# Patient Record
Sex: Male | Born: 1970
Health system: Southern US, Community
[De-identification: ages and names within clinical notes are randomized; demographics above are authoritative.]

## PROBLEM LIST (undated history)

## (undated) DIAGNOSIS — I10 Essential (primary) hypertension: Secondary | ICD-10-CM

## (undated) DIAGNOSIS — E785 Hyperlipidemia, unspecified: Secondary | ICD-10-CM

## (undated) DIAGNOSIS — E291 Testicular hypofunction: Secondary | ICD-10-CM

## (undated) HISTORY — DX: Hyperlipidemia, unspecified: E78.5

## (undated) HISTORY — DX: Testicular hypofunction: E29.1

## (undated) HISTORY — DX: Essential (primary) hypertension: I10

## (undated) HISTORY — PX: TONSILLECTOMY: SUR1361

---

## 1999-02-12 ENCOUNTER — Emergency Department (HOSPITAL_COMMUNITY): Admission: EM | Admit: 1999-02-12 | Discharge: 1999-02-12 | Payer: Self-pay | Admitting: Emergency Medicine

## 2004-12-07 ENCOUNTER — Ambulatory Visit: Payer: Self-pay | Admitting: Licensed Clinical Social Worker

## 2005-05-04 ENCOUNTER — Ambulatory Visit: Payer: Self-pay | Admitting: Family Medicine

## 2005-09-14 ENCOUNTER — Ambulatory Visit: Payer: Self-pay | Admitting: Family Medicine

## 2005-12-17 ENCOUNTER — Ambulatory Visit: Payer: Self-pay | Admitting: Licensed Clinical Social Worker

## 2006-03-01 ENCOUNTER — Ambulatory Visit: Payer: Self-pay | Admitting: Licensed Clinical Social Worker

## 2006-03-08 ENCOUNTER — Ambulatory Visit: Payer: Self-pay | Admitting: Licensed Clinical Social Worker

## 2006-03-11 ENCOUNTER — Ambulatory Visit: Payer: Self-pay | Admitting: Licensed Clinical Social Worker

## 2006-03-22 ENCOUNTER — Ambulatory Visit: Payer: Self-pay | Admitting: Licensed Clinical Social Worker

## 2008-11-30 ENCOUNTER — Telehealth: Payer: Self-pay | Admitting: Family Medicine

## 2009-08-18 ENCOUNTER — Ambulatory Visit: Payer: Self-pay | Admitting: Family Medicine

## 2009-08-18 DIAGNOSIS — D239 Other benign neoplasm of skin, unspecified: Secondary | ICD-10-CM | POA: Insufficient documentation

## 2009-08-18 DIAGNOSIS — B351 Tinea unguium: Secondary | ICD-10-CM | POA: Insufficient documentation

## 2009-08-18 DIAGNOSIS — K429 Umbilical hernia without obstruction or gangrene: Secondary | ICD-10-CM | POA: Insufficient documentation

## 2009-08-18 LAB — CONVERTED CEMR LAB
Blood in Urine, dipstick: NEGATIVE
Glucose, Urine, Semiquant: NEGATIVE
Ketones, urine, test strip: NEGATIVE
Specific Gravity, Urine: 1.025
pH: 5

## 2009-08-19 LAB — CONVERTED CEMR LAB
AST: 32 units/L (ref 0–37)
BUN: 18 mg/dL (ref 6–23)
Basophils Absolute: 0 10*3/uL (ref 0.0–0.1)
Calcium: 9.8 mg/dL (ref 8.4–10.5)
Cholesterol: 323 mg/dL — ABNORMAL HIGH (ref 0–200)
Creatinine, Ser: 0.8 mg/dL (ref 0.4–1.5)
Direct LDL: 82.7 mg/dL
GFR calc non Af Amer: 113.79 mL/min (ref >60)
Glucose, Bld: 98 mg/dL (ref 70–99)
HCT: 46.5 % (ref 39.0–52.0)
HDL: 37.3 mg/dL — ABNORMAL LOW (ref >39.00)
Lymphocytes Relative: 28.1 % (ref 12.0–46.0)
Lymphs Abs: 1.8 10*3/uL (ref 0.7–4.0)
Monocytes Relative: 10.9 % (ref 3.0–12.0)
Platelets: 217 10*3/uL (ref 150.0–400.0)
RDW: 12.7 % (ref 11.5–14.6)
TSH: 0.94 microintl units/mL (ref 0.35–5.50)
Total Bilirubin: 0.5 mg/dL (ref 0.3–1.2)
Triglycerides: 1418 mg/dL — ABNORMAL HIGH (ref 0.0–149.0)

## 2010-04-25 NOTE — Assessment & Plan Note (Signed)
Summary: NEW PT/TO RE-EST/CJR   Vital Signs:  Patient profile:   40 year old male Height:      71 inches Weight:      210 pounds BMI:     29.39 BP sitting:   158 / 98  (left arm) Cuff size:   regular  Vitals Entered By: Raechel Ache, RN (Aug 18, 2009 9:07 AM) CC: To Re-establish, has a few concerns.   History of Present Illness: 39 yr old male to re-establish with [place] after an absence of about 4 years, and he wants a cpx. He is fasting. He asks me to look at 2 small skin lesions, one on both legs. these have been present for years, they do not change in appearance, and they are not symptomatic. second, he has developed a bulge at his belly button over the past few months which is new for him. There is no pain, and it does not bother him at all. Last, he has had an abnormal right great toenail for several years.   Preventive Screening-Counseling & Management  Alcohol-Tobacco     Smoking Status: never  Caffeine-Diet-Exercise     Does Patient Exercise: yes      Drug Use:  no.    Allergies (verified): No Known Drug Allergies  Past History:  Past Medical History: Hx chicken pox  Past Surgical History: Tonsillectomy  Family History: Reviewed history and no changes required. Family History Hypertension  Social History: Reviewed history and no changes required. Occupation: Copywriter, advertising Divorced Never Smoked Alcohol use-yes Drug use-no Regular exercise-yes Occupation:  employed Smoking Status:  never Drug Use:  no Does Patient Exercise:  yes  Review of Systems  The patient denies anorexia, fever, weight loss, weight gain, vision loss, decreased hearing, hoarseness, chest pain, syncope, dyspnea on exertion, peripheral edema, prolonged cough, headaches, hemoptysis, abdominal pain, melena, hematochezia, severe indigestion/heartburn, hematuria, incontinence, genital sores, muscle weakness, suspicious skin lesions, transient blindness, difficulty  walking, depression, unusual weight change, abnormal bleeding, enlarged lymph nodes, angioedema, breast masses, and testicular masses.    Physical Exam  General:  Well-developed,well-nourished,in no acute distress; alert,appropriate and cooperative throughout examination Head:  Normocephalic and atraumatic without obvious abnormalities. No apparent alopecia or balding. Eyes:  No corneal or conjunctival inflammation noted. EOMI. Perrla. Funduscopic exam benign, without hemorrhages, exudates or papilledema. Vision grossly normal. Ears:  External ear exam shows no significant lesions or deformities.  Otoscopic examination reveals clear canals, tympanic membranes are intact bilaterally without bulging, retraction, inflammation or discharge. Hearing is grossly normal bilaterally. Nose:  External nasal examination shows no deformity or inflammation. Nasal mucosa are pink and moist without lesions or exudates. Mouth:  Oral mucosa and oropharynx without lesions or exudates.  Teeth in good repair. Neck:  No deformities, masses, or tenderness noted. Chest Wall:  No deformities, masses, tenderness or gynecomastia noted. Lungs:  Normal respiratory effort, chest expands symmetrically. Lungs are clear to auscultation, no crackles or wheezes. Heart:  Normal rate and regular rhythm. S1 and S2 normal without gallop, murmur, click, rub or other extra sounds. Abdomen:  soft, non-tender, normal bowel sounds, no distention, no masses, no guarding, no rigidity, no rebound tenderness, no inguinal hernia, no hepatomegaly, and no splenomegaly.  Has a small reducible non-tender umbilical hernia.  Genitalia:  Testes bilaterally descended without nodularity, tenderness or masses. No scrotal masses or lesions. No penis lesions or urethral discharge. Msk:  No deformity or scoliosis noted of thoracic or lumbar spine.   Pulses:  R and L carotid,radial,femoral,dorsalis pedis and posterior tibial pulses are full and equal  bilaterally Extremities:  No clubbing, cyanosis, edema, or deformity noted with normal full range of motion of all joints.   Neurologic:  No cranial nerve deficits noted. Station and gait are normal. Plantar reflexes are down-going bilaterally. DTRs are symmetrical throughout. Sensory, motor and coordinative functions appear intact. Skin:  has a small well defined brown nodular lesion on both thighs Cervical Nodes:  No lymphadenopathy noted Axillary Nodes:  No palpable lymphadenopathy Inguinal Nodes:  No significant adenopathy Psych:  Cognition and judgment appear intact. Alert and cooperative with normal attention span and concentration. No apparent delusions, illusions, hallucinations   Impression & Recommendations:  Problem # 1:  HEALTH MAINTENANCE EXAM (ICD-V70.0)  Orders: UA Dipstick w/o Micro (automated)  (81003) Venipuncture (60454) TLB-Lipid Panel (80061-LIPID) TLB-BMP (Basic Metabolic Panel-BMET) (80048-METABOL) TLB-CBC Platelet - w/Differential (85025-CBCD) TLB-Hepatic/Liver Function Pnl (80076-HEPATIC) TLB-TSH (Thyroid Stimulating Hormone) (84443-TSH)  Problem # 2:  ONYCHOMYCOSIS (ICD-110.1)  His updated medication list for this problem includes:    Terbinafine Hcl 250 Mg Tabs (Terbinafine hcl) ..... Once daily  Problem # 3:  UMBILICAL HERNIA (ICD-553.1)  Problem # 4:  DERMATOFIBROMA (ICD-216.9)  Complete Medication List: 1)  Terbinafine Hcl 250 Mg Tabs (Terbinafine hcl) .... Once daily  Patient Instructions: 1)  Get labs. treat the toenail with Terbinafine. Do core exercises to tighten the abdominal muscles.  2)  Please schedule a follow-up appointment as needed .  Prescriptions: TERBINAFINE HCL 250 MG TABS (TERBINAFINE HCL) once daily  #30 x 2   Entered and Authorized by:   Nelwyn Salisbury MD   Signed by:   Nelwyn Salisbury MD on 08/18/2009   Method used:   Electronically to        [facility] # [phone]* (retail)       [address]       [place],  [place]  [address]       Ph: [phone]       Fax: [phone]   RxID:   [id number]   Laboratory Results   Urine Tests  Date/Time Recieved: Aug 18, 2009 10:49 AM  Date/Time Reported: Aug 18, 2009 10:49 AM   Routine Urinalysis   Color: yellow Appearance: Clear Glucose: negative   (Normal Range: Negative) Bilirubin: negative   (Normal Range: Negative) Ketone: negative   (Normal Range: Negative) Spec. Gravity: 1.025   (Normal Range: 1.003-1.035) Blood: negative   (Normal Range: Negative) pH: 5.0   (Normal Range: 5.0-8.0) Protein: negative   (Normal Range: Negative) Urobilinogen: 0.2   (Normal Range: 0-1) Nitrite: negative   (Normal Range: Negative) Leukocyte Esterace: negative   (Normal Range: Negative)    Comments: Wynona Canes, CMA  Aug 18, 2009 10:49 AM      Appended Document: NEW PT/TO RE-EST/CJR we discussed his mildly elevated BP, and I advised him to reduce his sodium intake. He will return in one month to recheck the BP.

## 2010-05-07 ENCOUNTER — Inpatient Hospital Stay (HOSPITAL_COMMUNITY)
Admission: EM | Admit: 2010-05-07 | Discharge: 2010-05-09 | DRG: 814 | Disposition: A | Discharge status: Home / Self Care | Payer: BC Managed Care – PPO | Source: Ambulatory Visit | Attending: Internal Medicine | Admitting: Internal Medicine

## 2010-05-07 DIAGNOSIS — R030 Elevated blood-pressure reading, without diagnosis of hypertension: Secondary | ICD-10-CM | POA: Diagnosis present

## 2010-05-07 DIAGNOSIS — R1013 Epigastric pain: Principal | ICD-10-CM | POA: Diagnosis present

## 2010-05-08 ENCOUNTER — Encounter (HOSPITAL_COMMUNITY): Payer: Self-pay | Admitting: Radiology

## 2010-05-08 ENCOUNTER — Emergency Department (HOSPITAL_COMMUNITY): Payer: BC Managed Care – PPO

## 2010-05-08 LAB — CARDIAC PANEL(CRET KIN+CKTOT+MB+TROPI)
Total CK: 163 U/L (ref 7–232)
Troponin I: <0.01 ng/mL (ref 0.00–0.06)

## 2010-05-08 LAB — COMPREHENSIVE METABOLIC PANEL
ALT: 40 U/L (ref 0–53)
AST: 31 U/L (ref 0–37)
CO2: 28 mEq/L (ref 19–32)
Chloride: 102 mEq/L (ref 96–112)
GFR calc Af Amer: >60 mL/min (ref >60)
GFR calc non Af Amer: >60 mL/min (ref >60)
Potassium: 4.1 mEq/L (ref 3.5–5.1)
Sodium: 142 mEq/L (ref 135–145)
Total Bilirubin: 0.8 mg/dL (ref 0.3–1.2)

## 2010-05-08 LAB — DIFFERENTIAL
Basophils Absolute: 0 10*3/uL (ref 0.0–0.1)
Lymphocytes Relative: 18 % (ref 12–46)
Monocytes Relative: 7 % (ref 3–12)
Neutro Abs: 9.2 10*3/uL — ABNORMAL HIGH (ref 1.7–7.7)

## 2010-05-08 LAB — RAPID URINE DRUG SCREEN, HOSP PERFORMED
Barbiturates: NOT DETECTED
Benzodiazepines: NOT DETECTED

## 2010-05-08 LAB — URINALYSIS, ROUTINE W REFLEX MICROSCOPIC
Bilirubin Urine: NEGATIVE
Hgb urine dipstick: NEGATIVE
Protein, ur: NEGATIVE mg/dL
Specific Gravity, Urine: 1.023 (ref 1.005–1.030)
Urobilinogen, UA: 0.2 mg/dL (ref 0.0–1.0)

## 2010-05-08 LAB — CBC
Hemoglobin: 15.8 g/dL (ref 13.0–17.0)
RBC: 5.34 MIL/uL (ref 4.22–5.81)
WBC: 12.4 10*3/uL — ABNORMAL HIGH (ref 4.0–10.5)

## 2010-05-08 LAB — LACTIC ACID, PLASMA: Lactic Acid, Venous: 1.5 mmol/L (ref 0.5–2.2)

## 2010-05-08 LAB — CK TOTAL AND CKMB (NOT AT ARMC)
CK, MB: 6.9 ng/mL (ref 0.3–4.0)
CK, MB: 4.8 ng/mL — ABNORMAL HIGH (ref 0.3–4.0)

## 2010-05-08 LAB — TROPONIN I
Troponin I: <0.01 ng/mL (ref 0.00–0.06)
Troponin I: 0.01 ng/mL (ref 0.00–0.06)

## 2010-05-08 MED ORDER — IOHEXOL 300 MG/ML  SOLN: 100.0000 mL | Freq: Once | INTRAMUSCULAR | Status: AC | PRN

## 2010-05-09 ENCOUNTER — Telehealth: Payer: Self-pay | Admitting: Family Medicine

## 2010-05-09 ENCOUNTER — Inpatient Hospital Stay (HOSPITAL_COMMUNITY): Payer: BC Managed Care – PPO

## 2010-05-09 LAB — DIFFERENTIAL
Basophils Relative: 0 % (ref 0–1)
Eosinophils Absolute: 0.1 10*3/uL (ref 0.0–0.7)
Eosinophils Relative: 1 % (ref 0–5)
Monocytes Absolute: 1.2 10*3/uL — ABNORMAL HIGH (ref 0.1–1.0)
Monocytes Relative: 12 % (ref 3–12)
Neutrophils Relative %: 72 % (ref 43–77)

## 2010-05-09 LAB — CBC
MCH: 30.1 pg (ref 26.0–34.0)
MCHC: 34.8 g/dL (ref 30.0–36.0)
MCV: 86.5 fL (ref 78.0–100.0)
Platelets: 197 10*3/uL (ref 150–400)
RBC: 4.89 MIL/uL (ref 4.22–5.81)
RDW: 12.7 % (ref 11.5–15.5)

## 2010-05-09 LAB — LIPASE, BLOOD: Lipase: 40 U/L (ref 11–59)

## 2010-05-09 LAB — COMPREHENSIVE METABOLIC PANEL
CO2: 27 mEq/L (ref 19–32)
Calcium: 9 mg/dL (ref 8.4–10.5)
Chloride: 104 mEq/L (ref 96–112)
Creatinine, Ser: 0.95 mg/dL (ref 0.4–1.5)
GFR calc non Af Amer: >60 mL/min (ref >60)
Glucose, Bld: 120 mg/dL — ABNORMAL HIGH (ref 70–99)
Total Bilirubin: 1.2 mg/dL (ref 0.3–1.2)

## 2010-05-09 LAB — PHOSPHORUS: Phosphorus: 2.4 mg/dL (ref 2.3–4.6)

## 2010-05-09 LAB — LIPID PANEL
Cholesterol: 181 mg/dL (ref 0–200)
Total CHOL/HDL Ratio: 4.4 RATIO

## 2010-05-09 LAB — GLUCOSE, CAPILLARY: Glucose-Capillary: 111 mg/dL — ABNORMAL HIGH (ref 70–99)

## 2010-05-09 LAB — PROTIME-INR: Prothrombin Time: 13.3 seconds (ref 11.6–15.2)

## 2010-05-09 MED ORDER — TECHNETIUM TC 99M MEBROFENIN IV KIT: 5.0000 | PACK | Freq: Once | INTRAVENOUS | Status: DC | PRN

## 2010-05-09 MED ORDER — TECHNETIUM TC 99M MEBROFENIN IV KIT
5.0000 | PACK | Freq: Once | INTRAVENOUS | Status: AC | PRN
  Administered 2010-05-09: 5 via INTRAVENOUS

## 2010-05-09 NOTE — Telephone Encounter (Signed)
Pt called and said that he is in hospital and is req that Dr Clent Ridges call him back. Pt has some questions.[phone]

## 2010-05-09 NOTE — Telephone Encounter (Signed)
Please find out what he needs?

## 2010-05-11 ENCOUNTER — Encounter: Payer: Self-pay | Admitting: Family Medicine

## 2010-05-11 ENCOUNTER — Ambulatory Visit (INDEPENDENT_AMBULATORY_CARE_PROVIDER_SITE_OTHER): Payer: BC Managed Care – PPO | Admitting: Family Medicine

## 2010-05-11 DIAGNOSIS — I10 Essential (primary) hypertension: Secondary | ICD-10-CM

## 2010-05-11 DIAGNOSIS — R1013 Epigastric pain: Secondary | ICD-10-CM

## 2010-05-11 MED ORDER — LISINOPRIL 10 MG PO TABS: 10.0000 mg | ORAL_TABLET | Freq: Every day | ORAL | Status: DC

## 2010-05-11 NOTE — Telephone Encounter (Signed)
Spoke with pt this am and he said someone called him yest and he has appt to see Dr Clent Ridges today.

## 2010-05-11 NOTE — Progress Notes (Signed)
  Subjective:    Patient ID: Kevin Wilkinson, male    DOB: 03/16/1971, 40 y.o.   MRN: [id number]  HPI Here to follow up on a hospital stay from 05-08-10 to 05-09-10 for severe epigastric pain with nausea and vomiting. His WBC count was mildly elevated to 12.4 but otherwise all labs were normal. An abdominal CT scan was normal. A HIDA scan showed a normal gallbladder and CBD, but the gallbladder EF was low. He recovered very quickly, and by the time of DC he was eating and drinking with no trouble at all. Since going home he has felt fine. It was decided that he probably had passed a gallstone, and I agree with this. He has been taking Pantoprazole and watching his diet carefully. Also, his BP was consistently high in the hospital, and in fact we have noted it to be high for the past year.    Review of Systems  Constitutional: Negative.   Respiratory: Negative.   Cardiovascular: Negative.   Gastrointestinal: Negative.        Objective:   Physical Exam  Constitutional: He appears well-developed and well-nourished.  Cardiovascular: Normal rate, regular rhythm, normal heart sounds and intact distal pulses.  Exam reveals no gallop and no friction rub.   No murmur heard. Pulmonary/Chest: Effort normal and breath sounds normal. No respiratory distress. He has no wheezes. He has no rales. He exhibits no tenderness.  Abdominal: Soft. Bowel sounds are normal. He exhibits no distension and no mass. There is no tenderness. There is no rebound and no guarding.          Assessment & Plan:  He seems to have passed a gallstone, and now he is doing well. Continue the diet and PPI. We will treat the HTN now,and he agrees.

## 2010-05-19 NOTE — Discharge Summary (Signed)
Kevin Wilkinson, Kevin Wilkinson                ACCOUNT NO.:  [id number]  MEDICAL RECORD NO.:  [id number]           PATIENT TYPE:  LOCATION:                                 FACILITY:  PHYSICIAN:  Rock Nephew, MD       DATE OF BIRTH:  March 27, 1970  DATE OF ADMISSION:  05/08/2010 DATE OF DISCHARGE:  05/09/2010                        DISCHARGE SUMMARY - REFERRING   PRIMARY CARE PHYSICIAN:  Tera Mater. Clent Ridges, MD.  DISCHARGE DIAGNOSES: 1. Epigastric pain of unclear etiology, possibly biliary colic versus     a passed gallstone, HIDA scan pending. 2. Increased blood pressure, episodic.  Discharge medications for the patient are as follows: 1. Zofran 4 mg by mouth every 6 hours as needed for nausea. 2. Pantoprazole 40 mg p.o. daily. 3. Vicodin 5/500 one tablet by mouth daily.  DISPOSITION:  The patient is discharged home.  DIET:  Regular.  FOLLOWUP:  The patient should follow up with his primary care physician, Dr. Tera Mater. Clent Ridges within 1 week.  The patient should obtain a referral to see a gastroenterologist for further evaluation.  He should see the gastroenterologist within 1-2 weeks.  PROCEDURES PERFORMED:  The patient had a CT scan of the abdomen and pelvis with contrast which showed no acute findings to explain the patient's symptoms, punctuate nonobstructive stone, upper pole of left kidney.  Small fat-continuing umbilical hernia.  The patient also has a HIDA scan which is pending, it will be dictated again.  CONSULTATIONS ON THIS CASE:  None.  BRIEF HISTORY AND PHYSICAL:  Epigastric pain in 40 year old gentleman with past history of occasional elevated blood pressure, for which, he is not taking anything.  He presented to the emergency department with severe epigastric pain that started about an hour go after he ate some chilly.  It was followed with nausea and vomiting.  The patient could not find a comfortable position.  He came to the hospital writhing in pain.  The patient's CT  scan and labs were essentially unremarkable.  He did have some leukocytosis.  HOSPITAL COURSE: 1. Epigastric pain.  The patient came to the hospital with epigastric     pain.  By the time I saw the patient on May 08, 2010, the     patient was completely asymptomatic.  He was able to eat all 100%     of his diet without any difficulty.  He was having no symptoms,     whatsoever.  The patient was placed on a PPI.  Right now, a HIDA     scan is pending.  If the HIDA scan is unremarkable, the patient     will be discharged home. 2. Increased blood pressure.  The patient had some elevated episodic     blood pressure.  The patient received IV fluids.  This could be a     contributing factor.  However, the patient may need to be on blood     pressure medications.  The patient should continue follow up with     Dr. Tera Mater. Clent Ridges and decide whether or not the patient should be  on any antihypertensives. 3. DVT prophylaxis.  For DVT prophylaxis, the patient received SCDs.  Please note that this is not an official document until electronically signed.     Rock Nephew, MD     NH/MEDQ  D:  05/09/2010  T:  05/09/2010  Job:  [id number]  cc:   Jeannett Senior A. Clent Ridges, MD  Electronically Signed by Rock Nephew MD on 05/19/2010 06:20:51 PM

## 2010-05-28 NOTE — H&P (Signed)
Kevin Wilkinson, Kevin Wilkinson                ACCOUNT NO.:  [id number]  MEDICAL RECORD NO.:  [id number]           PATIENT TYPE:  E  LOCATION:  [facility]                         FACILITY:  [facility]  PHYSICIAN:  Michiel Cowboy, MDDATE OF BIRTH:  12-29-1970  DATE OF ADMISSION:  05/07/2010 DATE OF DISCHARGE:                             HISTORY & PHYSICAL   PRIMARY CARE PROVIDER:  Tera Mater. Clent Ridges, MD  CHIEF COMPLAINT:  Epigastric pain.  The patient is a 40 year old gentleman with past history of occasionally elevated blood pressure for which he is not taking anything.  He presented to the emergency department with a severe epigastric pain that started about 1 hour after he ate some chilly.  It was followed with nausea and vomiting.  The patient could not find a comfortable position. He was writhing at the time of arrival to the emergency department.  All his labs were within normal limits.  His CT scan did not show any evidence of pancreatitis.  Gallbladder appeared to be normal, although this was not a dissection specific CT scan, they were able to see his blood flow very well and there was no evidence to be consistent with dissection.  The patient's pain is somewhat under control with Dilaudid.  His blood pressure was noted to be elevated in 200s.  He was given nitro with some improvement.  The patient currently with somewhat controlled pain, still has a little bit of nausea, but not currently vomiting.  He denies any chest pain in particular.  He states when his pain is severe, it is hard for him to take a deep breath, but when his pain is under better control, he is able to deep breathe without evidence of pleurisy.  No fevers.  No chills.  No diarrhea, otherwise review of systems only significant for epigastric pain, nausea, and vomiting.  PAST MEDICAL HISTORY:  Significant for occasionally elevated blood pressure, although he was never completely diagnosed with hypertension.  SOCIAL  HISTORY:  The patient does not smoke or drink or use drugs.  His last alcoholic drink was actually just a little bit on New Year.  FAMILY HISTORY:  Noncontributory.  ALLERGIES:  None.  MEDICATIONS:  None.  PHYSICAL EXAMINATION:  VITAL SIGNS:  Temperature 97.7, blood pressure initially 205/124 now down to 189/114, respirations 20s, heart rate 66, satting 97% on room air. GENERAL:  The patient does not appear to be in acute distress, although does appear to be in some pain, is somewhat pale. HEENT:  Head is nontraumatic.  Somewhat dry mucous membranes. LUNGS:  Clear to auscultation bilaterally. HEART:  Regular rhythm.  No murmurs appreciated. ABDOMEN:  No rigidity or peritoneal signs noted, but there is definitely decreased bowel sounds and significant epigastric tenderness. EXTREMITIES:  Lower extremities without clubbing, cyanosis, or edema. NEUROLOGIC:  Grossly intact.  LABORATORY DATA:  White blood cell count 12.4, hemoglobin 15.8.  Sodium 142, potassium 4.1, creatinine 1.19.  LFTs within normal limits.  Lipase 33.  Lactic acid 1.3.  Total CK 302, CK-MB 6.9, troponin within normal limits.  CT scan showing no gallstones on gallbladder.  Liver and biliary  tree within normal limits.  Pancreas within normal limits.  No evidence of dissection per further discussion with Radiology by Dr. Norlene Campbell.  EKG showing normal sinus rhythm.  He has some T-wave inversion in lead V1 and somewhat atypical ST segment in V2, although would not consider that an ischemic change at this point, otherwise unremarkable EKG.  ASSESSMENT AND PLAN:  This is a 40 year old gentleman with significant abdominal pain and significantly elevated blood pressure likely related to his pain.  Epigastric pain, etiology still unclear to me.  A CT scan did not show any evidence of ischemic changes or pancreatitis or cholecystitis is encouraging.  We will continue with pain management.  We will also write for Anaspaz,  order HIDA scan, repeat lipase, consider GI consult for possible EGD.  If otherwise workup is unremarkable, the patient did receive GI cocktail, we will see if this improves his symptoms.  We will put him on Protonix for now.  Elevated blood pressure; I wonder if he actually has an underlying hypertension.  For now, we will see if we can wean off the nitro drip and control with hydralazine and also see if his blood pressure improves once his pain is under control.  Prophylaxis, SCDs and Protonix.     Michiel Cowboy, MD     AVD/MEDQ  D:  05/08/2010  T:  05/08/2010  Job:  [id number]  cc:   Jeannett Senior A. Clent Ridges, MD  Electronically Signed by Therisa Doyne MD on 05/28/2010 10:06:50 PM

## 2010-08-23 ENCOUNTER — Encounter: Payer: Self-pay | Admitting: Family Medicine

## 2010-08-23 ENCOUNTER — Ambulatory Visit (INDEPENDENT_AMBULATORY_CARE_PROVIDER_SITE_OTHER): Payer: BC Managed Care – PPO | Admitting: Family Medicine

## 2010-08-23 VITALS — BP 132/90 | Temp 98.9°F | Wt 215.0 lb

## 2010-08-23 DIAGNOSIS — L509 Urticaria, unspecified: Secondary | ICD-10-CM

## 2010-08-23 DIAGNOSIS — K429 Umbilical hernia without obstruction or gangrene: Secondary | ICD-10-CM

## 2010-08-23 DIAGNOSIS — T6391XA Toxic effect of contact with unspecified venomous animal, accidental (unintentional), initial encounter: Secondary | ICD-10-CM

## 2010-08-23 DIAGNOSIS — T63441A Toxic effect of venom of bees, accidental (unintentional), initial encounter: Secondary | ICD-10-CM

## 2010-08-23 MED ORDER — METHYLPREDNISOLONE ACETATE 80 MG/ML IJ SUSP
120.0000 mg | Freq: Once | INTRAMUSCULAR | Status: AC
  Administered 2010-08-23: 120 mg via INTRAMUSCULAR

## 2010-08-23 MED ORDER — PREDNISONE (PAK) 10 MG PO TABS: ORAL_TABLET | ORAL | Status: DC

## 2010-08-23 NOTE — Progress Notes (Signed)
  Subjective:    Patient ID: Kevin Wilkinson, male    DOB: 09-13-1970, 40 y.o.   MRN: [id number]  HPI Here for 3 days of hives and swelling after he was stung by yellow jackets five times last weekend. He ran over a nest while mowing his yard, and he was stung twice on each lower leg and once on the right hand. The right hand immediately swelled up and has remained swollen and painful since then. He has developed itchy areas of rash over the trunk and the neck, and his lips started to swell today. No tongue swelling or trouble swallowing or SOB. He has been taking Benadryl.    Review of Systems  Constitutional: Negative.   HENT: Positive for facial swelling.   Respiratory: Negative.   Cardiovascular: Negative.   Skin: Positive for rash.       Objective:   Physical Exam  Constitutional: He appears well-developed and well-nourished.  HENT:       Both lips are mildly swollen   Neck: No thyromegaly present.  Cardiovascular: Normal rate, regular rhythm, normal heart sounds and intact distal pulses.   Pulmonary/Chest: Effort normal and breath sounds normal.  Lymphadenopathy:    He has no cervical adenopathy.  Skin:       He has scattered hives over the back of the neck and the trunk. The right hand is swollen, red, and tender           Assessment & Plan:  This is hives as an allergic reaction to multiple bee stings. He will continue to use Benadryl. Given a steroid shot today, to be followed by an oral taper pack for 6 days.

## 2010-08-24 ENCOUNTER — Ambulatory Visit: Payer: BC Managed Care – PPO | Admitting: Family Medicine

## 2011-05-14 ENCOUNTER — Other Ambulatory Visit: Payer: Self-pay | Admitting: Family Medicine

## 2011-05-15 ENCOUNTER — Telehealth: Payer: Self-pay | Admitting: Family Medicine

## 2011-05-15 MED ORDER — LISINOPRIL 10 MG PO TABS: 10.0000 mg | ORAL_TABLET | Freq: Every day | ORAL | Status: DC

## 2011-05-15 NOTE — Telephone Encounter (Signed)
Script sent e-scribe 

## 2011-08-09 ENCOUNTER — Other Ambulatory Visit (INDEPENDENT_AMBULATORY_CARE_PROVIDER_SITE_OTHER): Payer: BC Managed Care – PPO

## 2011-08-09 DIAGNOSIS — Z0000 Encounter for general adult medical examination without abnormal findings: Secondary | ICD-10-CM

## 2011-08-09 LAB — HEPATIC FUNCTION PANEL
AST: 24 U/L (ref 0–37)
Albumin: 4.4 g/dL (ref 3.5–5.2)
Alkaline Phosphatase: 55 U/L (ref 39–117)
Bilirubin, Direct: 0.1 mg/dL (ref 0.0–0.3)
Total Bilirubin: 1.2 mg/dL (ref 0.3–1.2)

## 2011-08-09 LAB — CBC WITH DIFFERENTIAL/PLATELET
Basophils Absolute: 0 10*3/uL (ref 0.0–0.1)
Hemoglobin: 14.9 g/dL (ref 13.0–17.0)
Lymphocytes Relative: 29.6 % (ref 12.0–46.0)
Monocytes Relative: 11.6 % (ref 3.0–12.0)
Neutro Abs: 3.3 10*3/uL (ref 1.4–7.7)
Neutrophils Relative %: 56.3 % (ref 43.0–77.0)
RDW: 12.8 % (ref 11.5–14.6)

## 2011-08-09 LAB — POCT URINALYSIS DIPSTICK
Leukocytes, UA: NEGATIVE
Nitrite, UA: NEGATIVE
Protein, UA: NEGATIVE
Urobilinogen, UA: 0.2
pH, UA: 5.5

## 2011-08-09 LAB — LIPID PANEL
HDL: 51.9 mg/dL (ref >39.00)
Total CHOL/HDL Ratio: 5
Triglycerides: 167 mg/dL — ABNORMAL HIGH (ref 0.0–149.0)
VLDL: 33.4 mg/dL (ref 0.0–40.0)

## 2011-08-09 LAB — BASIC METABOLIC PANEL
Calcium: 9.6 mg/dL (ref 8.4–10.5)
Creatinine, Ser: 0.9 mg/dL (ref 0.4–1.5)
GFR: 95.9 mL/min (ref >60.00)
Glucose, Bld: 81 mg/dL (ref 70–99)
Sodium: 138 mEq/L (ref 135–145)

## 2011-08-10 LAB — LDL CHOLESTEROL, DIRECT: Direct LDL: 163.7 mg/dL

## 2011-08-14 NOTE — Progress Notes (Signed)
Quick Note:  Left voice message with below information. ______

## 2011-08-15 ENCOUNTER — Encounter: Payer: Self-pay | Admitting: Family Medicine

## 2011-08-24 ENCOUNTER — Encounter: Payer: Self-pay | Admitting: Family Medicine

## 2011-08-24 ENCOUNTER — Ambulatory Visit (INDEPENDENT_AMBULATORY_CARE_PROVIDER_SITE_OTHER): Payer: BC Managed Care – PPO | Admitting: Family Medicine

## 2011-08-24 ENCOUNTER — Telehealth: Payer: Self-pay

## 2011-08-24 VITALS — BP 128/80 | HR 85 | Temp 98.6°F | Ht 70.75 in | Wt 205.0 lb

## 2011-08-24 DIAGNOSIS — Z0000 Encounter for general adult medical examination without abnormal findings: Secondary | ICD-10-CM

## 2011-08-24 MED ORDER — TERBINAFINE HCL 250 MG PO TABS: 250.0000 mg | ORAL_TABLET | Freq: Every day | ORAL | Status: AC

## 2011-08-24 MED ORDER — FLUTICASONE PROPIONATE 50 MCG/ACT NA SUSP: 2.0000 | Freq: Every day | NASAL | Status: DC

## 2011-08-24 MED ORDER — LISINOPRIL 10 MG PO TABS: 10.0000 mg | ORAL_TABLET | Freq: Every day | ORAL | Status: DC

## 2011-08-24 NOTE — Telephone Encounter (Signed)
This may be the result of nasal congestion. Call in [facility] for 2 sprays each nostril daily, one year supply. This should help after using it a week or so

## 2011-08-24 NOTE — Telephone Encounter (Signed)
I sent script e-scribe and spoke with pt. 

## 2011-08-24 NOTE — Telephone Encounter (Signed)
Call back on patient cell [phone]

## 2011-08-24 NOTE — Telephone Encounter (Signed)
Please call back on [phone]

## 2011-08-24 NOTE — Progress Notes (Signed)
  Subjective:    Patient ID: Kevin Wilkinson, male    DOB: Aug 17, 1970, 41 y.o.   MRN: [id number]  HPI 41 yr old male for a cpx. He feels well. He does ask to treat a fungal infection in the left great toenail.    Review of Systems  Constitutional: Negative.   HENT: Negative.   Eyes: Negative.   Respiratory: Negative.   Cardiovascular: Negative.   Gastrointestinal: Negative.   Genitourinary: Negative.   Musculoskeletal: Negative.   Skin: Negative.   Neurological: Negative.   Hematological: Negative.   Psychiatric/Behavioral: Negative.        Objective:   Physical Exam  Constitutional: He is oriented to person, place, and time. He appears well-developed and well-nourished. No distress.  HENT:  Head: Normocephalic and atraumatic.  Right Ear: External ear normal.  Left Ear: External ear normal.  Nose: Nose normal.  Mouth/Throat: Oropharynx is clear and moist. No oropharyngeal exudate.  Eyes: Conjunctivae and EOM are normal. Pupils are equal, round, and reactive to light. Right eye exhibits no discharge. Left eye exhibits no discharge. No scleral icterus.  Neck: Neck supple. No JVD present. No tracheal deviation present. No thyromegaly present.  Cardiovascular: Normal rate, regular rhythm, normal heart sounds and intact distal pulses.  Exam reveals no gallop and no friction rub.   No murmur heard. Pulmonary/Chest: Effort normal and breath sounds normal. No respiratory distress. He has no wheezes. He has no rales. He exhibits no tenderness.  Abdominal: Soft. Bowel sounds are normal. He exhibits no distension and no mass. There is no tenderness. There is no rebound and no guarding.  Genitourinary: Rectum normal, prostate normal and penis normal. Guaiac negative stool. No penile tenderness.  Musculoskeletal: Normal range of motion. He exhibits no edema and no tenderness.  Lymphadenopathy:    He has no cervical adenopathy.  Neurological: He is alert and oriented to person, place, and  time. He has normal reflexes. No cranial nerve deficit. He exhibits normal muscle tone. Coordination normal.  Skin: Skin is warm and dry. No rash noted. He is not diaphoretic. No erythema. No pallor.  Psychiatric: He has a normal mood and affect. His behavior is normal. Judgment and thought content normal.          Assessment & Plan:  Well exam. Treat the toenail with 3 months of Terbinafine.

## 2011-08-24 NOTE — Telephone Encounter (Signed)
Patient called in stating that he forgot to ask Dr Clent Ridges this morning about his snoring? Patient states his wife is telling him that he is snoring "bad" at night? Pt states that he feels like he is getting enough sleep at night. Patient would like to know if there is anything he should do/try to help this? Please advise?

## 2011-10-22 ENCOUNTER — Encounter: Payer: Self-pay | Admitting: Family Medicine

## 2011-10-22 ENCOUNTER — Ambulatory Visit (INDEPENDENT_AMBULATORY_CARE_PROVIDER_SITE_OTHER): Payer: BC Managed Care – PPO | Admitting: Family Medicine

## 2011-10-22 VITALS — BP 130/90 | HR 88 | Temp 98.7°F | Wt 207.0 lb

## 2011-10-22 DIAGNOSIS — R6882 Decreased libido: Secondary | ICD-10-CM

## 2011-10-22 NOTE — Progress Notes (Signed)
  Subjective:    Patient ID: Kevin Wilkinson, male    DOB: 07-Jul-1970, 41 y.o.   MRN: [id number]  HPI Here to discuss possible problems with low libido. He was here earlier this year for a cpx with labs, and everything came out fine including his thyroid function. He denies any erectile problems, but he says his fiance gets frustrated when his sexual desires do not match hers. This has caused some problems between them. He wants to get his testosterone level checked. He admits to some stress in his life, between his job, his relationships to his fiance, and his relationship to the children. They are engaged to be married in 2 months, and no doubt this is stressful for him. His he juggles his time with his own child (who lives with his ex-wife) and his fiance's child (who also spends some time with her ex-husband). He does exercise, and he thinks he gets enough sleep.    Review of Systems  Constitutional: Negative.   Genitourinary: Negative.   Neurological: Negative.   Psychiatric/Behavioral: Negative.        Objective:   Physical Exam  Constitutional: He appears well-developed and well-nourished.  Psychiatric: He has a normal mood and affect. His behavior is normal. Judgment and thought content normal.          Assessment & Plan:  Low libido. I think a lot of his problem is internalized stress, but we will check a testosterone level today as well. We spent 45 minutes together, greater than 50% of which was in counseling.

## 2011-10-29 NOTE — Progress Notes (Signed)
Quick Note:  I left voice message for pt to return my call. ______ 

## 2011-10-30 MED ORDER — TESTOSTERONE 20.25 MG/ACT (1.62%) TD GEL: 4.0000 "application " | Freq: Every day | TRANSDERMAL | Status: DC

## 2011-10-30 NOTE — Addendum Note (Signed)
Addended by: Aniceto Boss A on: 10/30/2011 08:58 AM   Modules accepted: Orders

## 2011-10-30 NOTE — Progress Notes (Signed)
Quick Note:  I spoke with pt and called in script ______ 

## 2012-05-17 ENCOUNTER — Other Ambulatory Visit: Payer: Self-pay | Admitting: Family Medicine

## 2012-05-19 NOTE — Telephone Encounter (Signed)
Refill for another 6 months  

## 2012-06-13 IMAGING — NM NM HEPATO W/GB/PHARM/[PERSON_NAME]
2 series · 12 of 12 positions shown · non-contrast
Comparison: none

CLINICAL DATA: Abdominal pain, nausea, vomiting.  Negative CT.

[he hepatobiliary · 3.43mm/px · 6 of 60 frames shown (1 of 2)]
[frame 6/60]
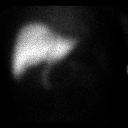
[frame 16/60]
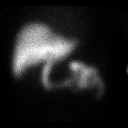
[frame 26/60]
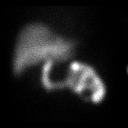
[frame 36/60]
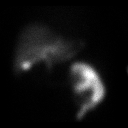
[frame 46/60]
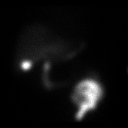
[frame 56/60]
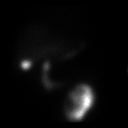

[he hepatobiliary · 3.43mm/px · 6 of 30 frames shown (2 of 2)]
[frame 3/30]
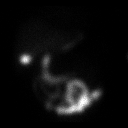
[frame 8/30]
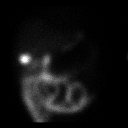
[frame 13/30]
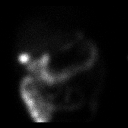
[frame 18/30]
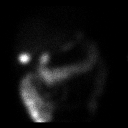
[frame 23/30]
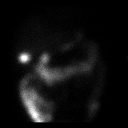
[frame 28/30]
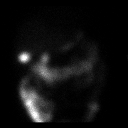

[12 of 12 positions shown; findings below may reference images not displayed]

HEPATOBILIARY SCINTIGRAPHY WITH EJECTION FRACTION

Anterior imaging after 9.MmCi ScUUU Choletec IV. There is prompt
clearance of the radiopharmaceutical from the blood pool. Timely
visualization of activity in central bile ducts, small bowel, and
gallbladder.
After 1 hour,1.91 mcg CCK was infused intravenously and imaging
continued. No symptoms reported with infusion. The calculated
>30% at  thirty minutes (Ziessman et al., 5886 J. Nuclear Med).

IMPRESSION
1. Patency of cystic and common bile ducts.
2. Low gallbladder ejection fraction.

## 2012-08-09 ENCOUNTER — Other Ambulatory Visit: Payer: Self-pay | Admitting: Family Medicine

## 2012-08-25 ENCOUNTER — Other Ambulatory Visit (INDEPENDENT_AMBULATORY_CARE_PROVIDER_SITE_OTHER): Payer: BC Managed Care – PPO

## 2012-08-25 DIAGNOSIS — Z0000 Encounter for general adult medical examination without abnormal findings: Secondary | ICD-10-CM

## 2012-08-25 LAB — POCT URINALYSIS DIPSTICK
Bilirubin, UA: NEGATIVE
Glucose, UA: NEGATIVE
Leukocytes, UA: NEGATIVE
Nitrite, UA: NEGATIVE
Urobilinogen, UA: 0.2

## 2012-08-25 LAB — HEPATIC FUNCTION PANEL
ALT: 32 U/L (ref 0–53)
Bilirubin, Direct: 0 mg/dL (ref 0.0–0.3)
Total Bilirubin: 1.1 mg/dL (ref 0.3–1.2)

## 2012-08-25 LAB — BASIC METABOLIC PANEL
BUN: 18 mg/dL (ref 6–23)
CO2: 26 mEq/L (ref 19–32)
Chloride: 103 mEq/L (ref 96–112)
Creatinine, Ser: 1 mg/dL (ref 0.4–1.5)
Glucose, Bld: 102 mg/dL — ABNORMAL HIGH (ref 70–99)
Potassium: 4.2 mEq/L (ref 3.5–5.1)

## 2012-08-25 LAB — LDL CHOLESTEROL, DIRECT: Direct LDL: 119.5 mg/dL

## 2012-08-25 LAB — CBC WITH DIFFERENTIAL/PLATELET
Basophils Relative: 0.4 % (ref 0.0–3.0)
Eosinophils Absolute: 0.2 10*3/uL (ref 0.0–0.7)
Eosinophils Relative: 3.7 % (ref 0.0–5.0)
Hemoglobin: 16.4 g/dL (ref 13.0–17.0)
Lymphocytes Relative: 24.4 % (ref 12.0–46.0)
MCHC: 35.3 g/dL (ref 30.0–36.0)
Neutro Abs: 3.2 10*3/uL (ref 1.4–7.7)
RBC: 5.2 Mil/uL (ref 4.22–5.81)
WBC: 5.4 10*3/uL (ref 4.5–10.5)

## 2012-08-25 LAB — LIPID PANEL
Total CHOL/HDL Ratio: 6
VLDL: 96 mg/dL — ABNORMAL HIGH (ref 0.0–40.0)

## 2012-08-26 NOTE — Progress Notes (Signed)
Quick Note:  Pt has appointment on 09/01/12 will go over then. ______

## 2012-09-01 ENCOUNTER — Encounter: Payer: BC Managed Care – PPO | Admitting: Family Medicine

## 2012-09-22 ENCOUNTER — Encounter: Payer: BC Managed Care – PPO | Admitting: Family Medicine

## 2012-10-16 ENCOUNTER — Ambulatory Visit (INDEPENDENT_AMBULATORY_CARE_PROVIDER_SITE_OTHER): Payer: BC Managed Care – PPO | Admitting: Family Medicine

## 2012-10-16 ENCOUNTER — Encounter: Payer: Self-pay | Admitting: Family Medicine

## 2012-10-16 VITALS — BP 140/90 | HR 77 | Temp 98.2°F | Ht 71.0 in | Wt 212.0 lb

## 2012-10-16 DIAGNOSIS — I10 Essential (primary) hypertension: Secondary | ICD-10-CM

## 2012-10-16 DIAGNOSIS — E291 Testicular hypofunction: Secondary | ICD-10-CM

## 2012-10-16 DIAGNOSIS — Z0000 Encounter for general adult medical examination without abnormal findings: Secondary | ICD-10-CM

## 2012-10-16 LAB — TESTOSTERONE: Testosterone: 102.42 ng/dL — ABNORMAL LOW (ref 350.00–890.00)

## 2012-10-16 MED ORDER — LISINOPRIL 20 MG PO TABS: 20.0000 mg | ORAL_TABLET | Freq: Every day | ORAL | Status: DC

## 2012-10-16 NOTE — Progress Notes (Signed)
  Subjective:    Patient ID: Kevin Wilkinson, male    DOB: 1970/08/24, 42 y.o.   MRN: [id number]  HPI 42 yr old male for a cpx. He feels well in general. He has been using Androgel for a year but he does not feel much different on it, as far as a low libido. He has put on a little weight recently.    Review of Systems  Constitutional: Negative.   HENT: Negative.   Eyes: Negative.   Respiratory: Negative.   Cardiovascular: Negative.   Gastrointestinal: Negative.   Genitourinary: Negative.   Musculoskeletal: Negative.   Skin: Negative.   Neurological: Negative.   Psychiatric/Behavioral: Negative.        Objective:   Physical Exam  Constitutional: He is oriented to person, place, and time. He appears well-developed and well-nourished. No distress.  HENT:  Head: Normocephalic and atraumatic.  Right Ear: External ear normal.  Left Ear: External ear normal.  Nose: Nose normal.  Mouth/Throat: Oropharynx is clear and moist. No oropharyngeal exudate.  Eyes: Conjunctivae and EOM are normal. Pupils are equal, round, and reactive to light. Right eye exhibits no discharge. Left eye exhibits no discharge. No scleral icterus.  Neck: Neck supple. No JVD present. No tracheal deviation present. No thyromegaly present.  Cardiovascular: Normal rate, regular rhythm, normal heart sounds and intact distal pulses.  Exam reveals no gallop and no friction rub.   No murmur heard. Pulmonary/Chest: Effort normal and breath sounds normal. No respiratory distress. He has no wheezes. He has no rales. He exhibits no tenderness.  Abdominal: Soft. Bowel sounds are normal. He exhibits no distension and no mass. There is no tenderness. There is no rebound and no guarding.  Genitourinary: Rectum normal, prostate normal and penis normal. Guaiac negative stool. No penile tenderness.  Musculoskeletal: Normal range of motion. He exhibits no edema and no tenderness.  Lymphadenopathy:    He has no cervical adenopathy.   Neurological: He is alert and oriented to person, place, and time. He has normal reflexes. No cranial nerve deficit. He exhibits normal muscle tone. Coordination normal.  Skin: Skin is warm and dry. No rash noted. He is not diaphoretic. No erythema. No pallor.  Psychiatric: He has a normal mood and affect. His behavior is normal. Judgment and thought content normal.          Assessment & Plan:  Well exam. He will watch the diet and exercise. Increase Lisinopril to 20 mg daily. Check a testosterone level

## 2012-10-17 DIAGNOSIS — I10 Essential (primary) hypertension: Secondary | ICD-10-CM | POA: Insufficient documentation

## 2012-10-17 DIAGNOSIS — E291 Testicular hypofunction: Secondary | ICD-10-CM | POA: Insufficient documentation

## 2012-10-17 NOTE — Addendum Note (Signed)
Addended by: Gershon Crane A on: 10/17/2012 01:02 PM   Modules accepted: Orders

## 2012-10-17 NOTE — Progress Notes (Signed)
Quick Note:  I left a voice message with results. ______

## 2012-10-22 ENCOUNTER — Encounter: Payer: Self-pay | Admitting: Family Medicine

## 2013-03-03 ENCOUNTER — Ambulatory Visit (INDEPENDENT_AMBULATORY_CARE_PROVIDER_SITE_OTHER): Payer: BC Managed Care – PPO | Admitting: Licensed Clinical Social Worker

## 2013-03-03 DIAGNOSIS — F4323 Adjustment disorder with mixed anxiety and depressed mood: Secondary | ICD-10-CM

## 2013-03-09 ENCOUNTER — Ambulatory Visit (INDEPENDENT_AMBULATORY_CARE_PROVIDER_SITE_OTHER): Payer: BC Managed Care – PPO | Admitting: Licensed Clinical Social Worker

## 2013-03-09 DIAGNOSIS — F4322 Adjustment disorder with anxiety: Secondary | ICD-10-CM

## 2013-03-17 ENCOUNTER — Ambulatory Visit (INDEPENDENT_AMBULATORY_CARE_PROVIDER_SITE_OTHER): Payer: BC Managed Care – PPO | Admitting: Licensed Clinical Social Worker

## 2013-03-17 DIAGNOSIS — F4323 Adjustment disorder with mixed anxiety and depressed mood: Secondary | ICD-10-CM

## 2013-03-31 ENCOUNTER — Telehealth: Payer: Self-pay | Admitting: Family Medicine

## 2013-03-31 NOTE — Telephone Encounter (Signed)
Yes, this is okay.

## 2013-03-31 NOTE — Telephone Encounter (Signed)
appt sche wed 1/7/h

## 2013-03-31 NOTE — Telephone Encounter (Signed)
Pt would like appt asap to see the Kevin Wilkinson. Pt has been dealing w/ foot pain. Went to ortho and pt does not feel they did anything for him. Pt would rather see Kevin Wilkinson for this issue. Only SD pls advise

## 2013-04-01 ENCOUNTER — Encounter: Payer: Self-pay | Admitting: Family Medicine

## 2013-04-01 ENCOUNTER — Ambulatory Visit (INDEPENDENT_AMBULATORY_CARE_PROVIDER_SITE_OTHER): Payer: BC Managed Care – PPO | Admitting: Family Medicine

## 2013-04-01 VITALS — BP 142/90 | HR 78 | Temp 98.6°F | Wt 214.0 lb

## 2013-04-01 DIAGNOSIS — M129 Arthropathy, unspecified: Secondary | ICD-10-CM

## 2013-04-01 DIAGNOSIS — M1990 Unspecified osteoarthritis, unspecified site: Secondary | ICD-10-CM

## 2013-04-01 LAB — SEDIMENTATION RATE: Sed Rate: 14 mm/hr (ref 0–22)

## 2013-04-01 LAB — URIC ACID: URIC ACID, SERUM: 8 mg/dL — AB (ref 4.0–7.8)

## 2013-04-01 LAB — RHEUMATOID FACTOR

## 2013-04-01 MED ORDER — DICLOFENAC SODIUM 75 MG PO TBEC: 75.0000 mg | DELAYED_RELEASE_TABLET | Freq: Two times a day (BID) | ORAL | Status: DC

## 2013-04-01 NOTE — Progress Notes (Signed)
Pre visit review using our clinic review tool, if applicable. No additional management support is needed unless otherwise documented below in the visit note. 

## 2013-04-01 NOTE — Progress Notes (Signed)
   Subjective:    Patient ID: Kevin Wilkinson, male    DOB: 05/31/1970, 43 y.o.   MRN: [id number]  HPI Here to discuss joint pains that startedabout 6 months ago. The worse area is his right forefoot and right ankle. The 5 MTPs of the right foot have some pain every day, and this can wax and wane. The ankle pain comes and goes. No swelling or redness or warmth. Sometime the same areas on the left foot and ankle bother him to a lesser degree. No pain in the hands or wrists or spine or hips. He has mild bilateral knee pain at times. No family hx of arthritis. He saw Dr. Delilah Shan at [facility] a few months ago and he was not sure what this could be. He took Etodolac for a month and it helped a little.    Review of Systems  Constitutional: Negative.   Musculoskeletal: Positive for arthralgias. Negative for back pain, joint swelling and neck pain.       Objective:   Physical Exam  Constitutional: He appears well-developed and well-nourished.  Musculoskeletal: Normal range of motion. He exhibits no edema and no tenderness.  No tenderness in either foot           Assessment & Plan:  This sounds like osteoarthritis. Try Diclofenac. Get labs today. He can add Osteo Biflex.

## 2013-04-06 ENCOUNTER — Ambulatory Visit: Payer: BC Managed Care – PPO | Admitting: Family Medicine

## 2013-04-08 MED ORDER — ALLOPURINOL 300 MG PO TABS: 300.0000 mg | ORAL_TABLET | Freq: Every day | ORAL | Status: DC

## 2013-04-08 NOTE — Addendum Note (Signed)
Addended by: Aggie Hacker A on: 04/08/2013 01:54 PM   Modules accepted: Orders

## 2013-08-31 ENCOUNTER — Telehealth: Payer: Self-pay | Admitting: Family Medicine

## 2013-08-31 NOTE — Telephone Encounter (Signed)
Pt is travelling to [place] from 6-15 to 09-11-13 for work and would like to know if he should take cipro and flagyl. [facility] on [address]

## 2013-09-01 NOTE — Telephone Encounter (Signed)
Good idea. Call in Cipro 500 mg bid #20 and Flagyl 500 mg bid #20

## 2013-09-02 MED ORDER — METRONIDAZOLE 500 MG PO TABS: 500.0000 mg | ORAL_TABLET | Freq: Two times a day (BID) | ORAL | Status: DC

## 2013-09-02 MED ORDER — CIPROFLOXACIN HCL 500 MG PO TABS: 500.0000 mg | ORAL_TABLET | Freq: Two times a day (BID) | ORAL | Status: DC

## 2013-09-02 NOTE — Telephone Encounter (Signed)
I sent both scripts e-scribe and left a voice message for pt. 

## 2013-10-03 ENCOUNTER — Other Ambulatory Visit: Payer: Self-pay | Admitting: Family Medicine

## 2014-01-03 ENCOUNTER — Other Ambulatory Visit: Payer: Self-pay | Admitting: Family Medicine

## 2014-02-02 ENCOUNTER — Ambulatory Visit (INDEPENDENT_AMBULATORY_CARE_PROVIDER_SITE_OTHER): Payer: BC Managed Care – PPO | Admitting: Licensed Clinical Social Worker

## 2014-02-02 DIAGNOSIS — F4323 Adjustment disorder with mixed anxiety and depressed mood: Secondary | ICD-10-CM

## 2014-02-12 ENCOUNTER — Ambulatory Visit (INDEPENDENT_AMBULATORY_CARE_PROVIDER_SITE_OTHER): Payer: BC Managed Care – PPO | Admitting: Licensed Clinical Social Worker

## 2014-02-12 DIAGNOSIS — F4323 Adjustment disorder with mixed anxiety and depressed mood: Secondary | ICD-10-CM

## 2014-03-01 ENCOUNTER — Ambulatory Visit (INDEPENDENT_AMBULATORY_CARE_PROVIDER_SITE_OTHER): Payer: BC Managed Care – PPO | Admitting: Licensed Clinical Social Worker

## 2014-03-01 DIAGNOSIS — F4323 Adjustment disorder with mixed anxiety and depressed mood: Secondary | ICD-10-CM

## 2014-03-03 ENCOUNTER — Other Ambulatory Visit: Payer: Self-pay | Admitting: Family Medicine

## 2014-03-04 NOTE — Telephone Encounter (Signed)
What is correct dose? There is 2 different dosages in pt's chart.

## 2014-03-10 ENCOUNTER — Telehealth: Payer: Self-pay | Admitting: Family Medicine

## 2014-03-10 NOTE — Telephone Encounter (Signed)
Patient need re-fill on allopurinol (ZYLOPRIM) 300 MG tablet, diclofenac (VOLTAREN) 75 MG EC tablet, and lisinopril (PRINIVIL,ZESTRIL) 10 MG tablet to [facility] on [address].  He states he needs each as a 90 day fill.   He did not pick the lisinopril up at [facility] due to this needed switch for his insurance plan.

## 2014-03-11 MED ORDER — DICLOFENAC SODIUM 75 MG PO TBEC: 75.0000 mg | DELAYED_RELEASE_TABLET | Freq: Two times a day (BID) | ORAL | Status: DC

## 2014-03-11 MED ORDER — LISINOPRIL 10 MG PO TABS: 10.0000 mg | ORAL_TABLET | Freq: Every day | ORAL | Status: DC

## 2014-03-11 MED ORDER — ALLOPURINOL 300 MG PO TABS: 300.0000 mg | ORAL_TABLET | Freq: Every day | ORAL | Status: DC

## 2014-03-11 NOTE — Telephone Encounter (Signed)
Left message on voicemail to call office.  

## 2014-03-11 NOTE — Telephone Encounter (Signed)
Pt called back, asked him about Rx's requested. Pt said needs them sent new pharmacy [facility] supply so they are covered by insurance. Told pt okay will send Rx's. Pt verbalized understanding.

## 2014-04-12 ENCOUNTER — Telehealth: Payer: Self-pay | Admitting: Family Medicine

## 2014-04-12 NOTE — Telephone Encounter (Signed)
Pt will call general surgeon to get follow up appt

## 2014-04-12 NOTE — Telephone Encounter (Signed)
Pt had referral in [id number] for umbilical hernia.  Pt did not proceed at that time, but would like to pursue at this time.  Pt last seen 04/01/13

## 2014-04-14 NOTE — Telephone Encounter (Signed)
Left message on vm to schedule an appt.

## 2014-04-14 NOTE — Telephone Encounter (Signed)
He will need an OV here first since he has not been seen for over a year

## 2014-04-19 ENCOUNTER — Encounter: Payer: Self-pay | Admitting: Family Medicine

## 2014-04-19 ENCOUNTER — Ambulatory Visit (INDEPENDENT_AMBULATORY_CARE_PROVIDER_SITE_OTHER): Payer: BLUE CROSS/BLUE SHIELD | Admitting: Family Medicine

## 2014-04-19 VITALS — BP 148/96 | Temp 98.7°F | Ht 71.0 in | Wt 219.0 lb

## 2014-04-19 DIAGNOSIS — K429 Umbilical hernia without obstruction or gangrene: Secondary | ICD-10-CM

## 2014-04-19 NOTE — Progress Notes (Signed)
Pre visit review using our clinic review tool, if applicable. No additional management support is needed unless otherwise documented below in the visit note. 

## 2014-04-19 NOTE — Progress Notes (Signed)
   Subjective:    Patient ID: Kevin Wilkinson, male    DOB: 08-01-1970, 44 y.o.   MRN: [id number]  HPI Here to recheck an umbilical hernia. We looked at this about 3 or 4 years ago and actually sent him to Surgery to evaluate. The hernia was smaller and did not bother him at that time, and the decision was made to observe only. Since then it has gotten larger and now causes him some pain from time to time.   Review of Systems  Constitutional: Negative.   Gastrointestinal: Positive for abdominal pain. Negative for nausea, vomiting, diarrhea, constipation, blood in stool, abdominal distention, anal bleeding and rectal pain.       Objective:   Physical Exam  Constitutional: He appears well-developed and well-nourished.  Abdominal: Soft. Bowel sounds are normal. He exhibits no distension. There is no rebound and no guarding.  He has a tender but reducible umbilical hernia           Assessment & Plan:  Refer to Surgery again.

## 2014-04-27 ENCOUNTER — Ambulatory Visit (INDEPENDENT_AMBULATORY_CARE_PROVIDER_SITE_OTHER): Payer: Self-pay | Admitting: Surgery

## 2014-04-27 NOTE — H&P (Signed)
History of Present Illness Kevin Wilkinson. Kevin Gorin MD; 04/27/2014 4:34 PM) Patient words: eval umb hernia.  The patient is a 44 year old male who presents with an umbilical hernia. Referred by Dr. Alysia Penna for evaluation of umbilical hernia This is a 44 year old male in good health who presents with a 3 year history of a small bowel, hernia. This has become larger and more uncomfortable. He was evaluated several years ago by a Psychologist, sport and exercise but at that time they chose not to repair it because it was quite small. Since it is becoming uncomfortable he presents now to discuss repair. He denies any obstructive symptoms. Other Problems Flossie Buffy, RN; 04/27/2014 3:21 PM) High blood pressure  Past Surgical History Flossie Buffy, RN; 04/27/2014 3:21 PM) Tonsillectomy  Diagnostic Studies History Flossie Buffy, RN; 04/27/2014 3:21 PM) Colonoscopy never  Allergies Flossie Buffy, RN; 04/27/2014 3:21 PM) No Known Drug Allergies 04/27/2014  Medication History Flossie Buffy, RN; 04/27/2014 3:22 PM) Allopurinol (300MG  Tablet, Oral daily) Active. Diclofenac Sodium (75MG  Tablet DR, Oral daily) Active. Lisinopril (20MG  Tablet, Oral daily) Active.  Social History Flossie Buffy, RN; 04/27/2014 3:21 PM) Alcohol use Moderate alcohol use. Caffeine use Tea. No drug use Tobacco use Never smoker.  Family History Flossie Buffy, RN; 04/27/2014 3:21 PM) Diabetes Mellitus Father. Hypertension Father, Mother.     Review of Systems Delilah Shan Moffitt RN; 04/27/2014 3:21 PM) General Not Present- Appetite Loss, Chills, Fatigue, Fever, Night Sweats, Weight Gain and Weight Loss. Skin Not Present- Change in Wart/Mole, Dryness, Hives, Jaundice, New Lesions, Non-Healing Wounds, Rash and Ulcer. HEENT Not Present- Earache, Hearing Loss, Hoarseness, Nose Bleed, Oral Ulcers, Ringing in the Ears, Seasonal Allergies, Sinus Pain, Sore Throat, Visual Disturbances, Wears glasses/contact lenses and Yellow  Eyes. Respiratory Not Present- Bloody sputum, Chronic Cough, Difficulty Breathing, Snoring and Wheezing. Breast Not Present- Breast Mass, Breast Pain, Nipple Discharge and Skin Changes. Cardiovascular Not Present- Chest Pain, Difficulty Breathing Lying Down, Leg Cramps, Palpitations, Rapid Heart Rate, Shortness of Breath and Swelling of Extremities. Gastrointestinal Not Present- Abdominal Pain, Bloating, Bloody Stool, Change in Bowel Habits, Chronic diarrhea, Constipation, Difficulty Swallowing, Excessive gas, Gets full quickly at meals, Hemorrhoids, Indigestion, Nausea, Rectal Pain and Vomiting. Male Genitourinary Not Present- Blood in Urine, Change in Urinary Stream, Frequency, Impotence, Nocturia, Painful Urination, Urgency and Urine Leakage. Musculoskeletal Not Present- Back Pain, Joint Pain, Joint Stiffness, Muscle Pain, Muscle Weakness and Swelling of Extremities. Neurological Not Present- Decreased Memory, Fainting, Headaches, Numbness, Seizures, Tingling, Tremor, Trouble walking and Weakness. Psychiatric Not Present- Anxiety, Bipolar, Change in Sleep Pattern, Depression, Fearful and Frequent crying. Endocrine Not Present- Cold Intolerance, Excessive Hunger, Hair Changes, Heat Intolerance, Hot flashes and New Diabetes. Hematology Not Present- Easy Bruising, Excessive bleeding, Gland problems, HIV and Persistent Infections.  Vitals Delilah Shan Moffitt RN; 04/27/2014 3:21 PM) 04/27/2014 3:21 PM Weight: 216.8 lb Height: 72in Body Surface Area: 2.24 m Body Mass Index: 29.4 kg/m Temp.: 98.47F(Oral)  Pulse: 68 (Regular)  Resp.: 16 (Unlabored)  BP: 132/88 (Sitting, Left Arm, Standard)     Physical Exam Rodman Key K. Markes Shatswell MD; 04/27/2014 4:34 PM)  The physical exam findings are as follows: Note:WDWN in NAD HEENT: EOMI, sclera anicteric Neck: No masses, no thyromegaly Lungs: CTA bilaterally; normal respiratory effort CV: Regular rate and rhythm; no murmurs Abd: +bowel sounds, soft,  protruding umbilical hernia - reducible Ext: Well-perfused; no edema Skin: Warm, dry; no sign of jaundice    Assessment & Plan Rodman Key K. Keric Zehren MD; 07/31/3218 2:54 PM)  UMBILICAL HERNIA WITHOUT OBSTRUCTION AND WITHOUT GANGRENE (  553.1  K42.9)  Current Plans Schedule for Surgery - umbilical hernia repair with mesh. The surgical procedure has been discussed with the patient. Potential risks, benefits, alternative treatments, and expected outcomes have been explained. All of the patient's questions at this time have been answered. The likelihood of reaching the patient's treatment goal is good. The patient understand the proposed surgical procedure and wishes to proceed.   Kevin Wilkinson. Georgette Dover, MD, [facility] Surgery  General/ Trauma Surgery  04/27/2014 4:36 PM

## 2014-04-29 ENCOUNTER — Ambulatory Visit (INDEPENDENT_AMBULATORY_CARE_PROVIDER_SITE_OTHER): Payer: BLUE CROSS/BLUE SHIELD | Admitting: Psychology

## 2014-04-29 DIAGNOSIS — F4323 Adjustment disorder with mixed anxiety and depressed mood: Secondary | ICD-10-CM

## 2014-05-05 ENCOUNTER — Ambulatory Visit (INDEPENDENT_AMBULATORY_CARE_PROVIDER_SITE_OTHER): Payer: BLUE CROSS/BLUE SHIELD | Admitting: Psychology

## 2014-05-05 DIAGNOSIS — F4323 Adjustment disorder with mixed anxiety and depressed mood: Secondary | ICD-10-CM

## 2014-05-14 ENCOUNTER — Ambulatory Visit (INDEPENDENT_AMBULATORY_CARE_PROVIDER_SITE_OTHER): Payer: BLUE CROSS/BLUE SHIELD | Admitting: Psychology

## 2014-05-14 DIAGNOSIS — F4323 Adjustment disorder with mixed anxiety and depressed mood: Secondary | ICD-10-CM

## 2014-06-09 ENCOUNTER — Ambulatory Visit (INDEPENDENT_AMBULATORY_CARE_PROVIDER_SITE_OTHER): Payer: BLUE CROSS/BLUE SHIELD | Admitting: Psychology

## 2014-06-09 DIAGNOSIS — F4323 Adjustment disorder with mixed anxiety and depressed mood: Secondary | ICD-10-CM

## 2014-06-16 ENCOUNTER — Ambulatory Visit: Payer: BLUE CROSS/BLUE SHIELD | Admitting: Psychology

## 2014-06-25 ENCOUNTER — Ambulatory Visit (INDEPENDENT_AMBULATORY_CARE_PROVIDER_SITE_OTHER): Payer: BLUE CROSS/BLUE SHIELD | Admitting: Psychology

## 2014-06-25 DIAGNOSIS — F4323 Adjustment disorder with mixed anxiety and depressed mood: Secondary | ICD-10-CM

## 2014-07-08 ENCOUNTER — Other Ambulatory Visit (HOSPITAL_COMMUNITY): Payer: Self-pay | Admitting: *Deleted

## 2014-07-08 NOTE — Pre-Procedure Instructions (Addendum)
KYNDALL CHAPLIN  07/08/2014   Your procedure is scheduled on:  Thursday, July 22, 2014 .   Report to [facility] Entrance "A" Admitting Office at 5:30 AM.   Call this number if you have problems the morning of surgery: [phone]               Any questions prior to day of surgery, please call [phone] between 8 & 4 PM.   Remember:   Do not eat food or drink liquids after midnight Wednesday, 07/21/14.   Take these medicines the morning of surgery with A SIP OF WATER:allopurinol    STOP all herbel meds, nsaids (aleve,naproxen,advil,ibuprofen) 5 days prior to surgery starting 07/17/14  Including vitamins, aspirin, diclofenac   Do not wear jewelry.  Do not wear lotions, powders, or cologne. You may wear deodorant.  Do not shave 48 hours prior to surgery. Men may shave face and neck.  Do not bring valuables to the hospital.  [facility] is not responsible                  for any belongings or valuables.               Contacts, dentures or bridgework may not be worn into surgery.  Leave suitcase in the car. After surgery it may be brought to your room.  For patients admitted to the hospital, discharge time is determined by your                treatment team.               Patients discharged the day of surgery will not be allowed to drive home.    Special Instructions:  Special Instructions: [place] - Preparing for Surgery  Before surgery, you can play an important role.  Because skin is not sterile, your skin needs to be as free of germs as possible.  You can reduce the number of germs on you skin by washing with CHG (chlorahexidine gluconate) soap before surgery.  CHG is an antiseptic cleaner which kills germs and bonds with the skin to continue killing germs even after washing.  Please DO NOT use if you have an allergy to CHG or antibacterial soaps.  If your skin becomes reddened/irritated stop using the CHG and inform your nurse when you arrive at Short Stay.  Do  not shave (including legs and underarms) for at least 48 hours prior to the first CHG shower.  You may shave your face.  Please follow these instructions carefully:   1.  Shower with CHG Soap the night before surgery and the morning of Surgery.  2.  If you choose to wash your hair, wash your hair first as usual with your normal shampoo.  3.  After you shampoo, rinse your hair and body thoroughly to remove the Shampoo.  4.  Use CHG as you would any other liquid soap.  You can apply chg directly  to the skin and wash gently with scrungie or a clean washcloth.  5.  Apply the CHG Soap to your body ONLY FROM THE NECK DOWN.  Do not use on open wounds or open sores.  Avoid contact with your eyes ears, mouth and genitals (private parts).  Wash genitals (private parts)       with your normal soap.  6.  Wash thoroughly, paying special attention to the area where your surgery will be performed.  7.  Thoroughly rinse your body with  warm water from the neck down.  8.  DO NOT shower/wash with your normal soap after using and rinsing off the CHG Soap.  9.  Pat yourself dry with a clean towel.            10.  Wear clean pajamas.            11.  Place clean sheets on your bed the night of your first shower and do not sleep with pets.  Day of Surgery  Do not apply any lotions/deodorants the morning of surgery.  Please wear clean clothes to the hospital/surgery center.    Please read over the following fact sheets that you were given: Pain Booklet, Coughing and Deep Breathing and Surgical Site Infection Prevention

## 2014-07-09 ENCOUNTER — Encounter (HOSPITAL_COMMUNITY)
Admission: RE | Admit: 2014-07-09 | Discharge: 2014-07-09 | Disposition: A | Discharge status: Home / Self Care | Payer: BLUE CROSS/BLUE SHIELD | Source: Ambulatory Visit | Attending: Surgery | Admitting: Surgery

## 2014-07-09 ENCOUNTER — Encounter (HOSPITAL_COMMUNITY): Payer: Self-pay

## 2014-07-09 DIAGNOSIS — Z01812 Encounter for preprocedural laboratory examination: Secondary | ICD-10-CM | POA: Insufficient documentation

## 2014-07-09 DIAGNOSIS — K429 Umbilical hernia without obstruction or gangrene: Secondary | ICD-10-CM | POA: Insufficient documentation

## 2014-07-09 DIAGNOSIS — Z01810 Encounter for preprocedural cardiovascular examination: Secondary | ICD-10-CM | POA: Insufficient documentation

## 2014-07-09 LAB — CBC
HCT: 46.3 % (ref 39.0–52.0)
Hemoglobin: 16.1 g/dL (ref 13.0–17.0)
MCH: 31.6 pg (ref 26.0–34.0)
MCHC: 34.8 g/dL (ref 30.0–36.0)
MCV: 91 fL (ref 78.0–100.0)
Platelets: 204 10*3/uL (ref 150–400)
RBC: 5.09 MIL/uL (ref 4.22–5.81)
RDW: 12.6 % (ref 11.5–15.5)
WBC: 7.9 10*3/uL (ref 4.0–10.5)

## 2014-07-09 LAB — BASIC METABOLIC PANEL WITH GFR
Anion gap: 8 (ref 5–15)
BUN: 16 mg/dL (ref 6–23)
CO2: 26 mmol/L (ref 19–32)
Calcium: 9.2 mg/dL (ref 8.4–10.5)
Chloride: 107 mmol/L (ref 96–112)
Creatinine, Ser: 1.05 mg/dL (ref 0.50–1.35)
GFR calc Af Amer: >90 mL/min
GFR calc non Af Amer: 85 mL/min — ABNORMAL LOW
Glucose, Bld: 113 mg/dL — ABNORMAL HIGH (ref 70–99)
Potassium: 4.4 mmol/L (ref 3.5–5.1)
Sodium: 141 mmol/L (ref 135–145)

## 2014-07-21 MED ORDER — CHLORHEXIDINE GLUCONATE 4 % EX LIQD
1.0000 "application " | Freq: Once | CUTANEOUS | Status: DC
  Filled 2014-07-21: qty 15

## 2014-07-21 MED ORDER — CEFAZOLIN SODIUM-DEXTROSE 2-3 GM-% IV SOLR
2.0000 g | INTRAVENOUS | Status: AC
  Administered 2014-07-22: 2 g via INTRAVENOUS

## 2014-07-22 ENCOUNTER — Encounter (HOSPITAL_COMMUNITY)
Admission: RE | Disposition: A | Discharge status: Home / Self Care | Payer: Self-pay | Source: Ambulatory Visit | Attending: Surgery

## 2014-07-22 ENCOUNTER — Ambulatory Visit (HOSPITAL_COMMUNITY)
Admission: RE | Admit: 2014-07-22 | Discharge: 2014-07-22 | Disposition: A | Discharge status: Home / Self Care | Payer: BLUE CROSS/BLUE SHIELD | Source: Ambulatory Visit | Attending: Surgery | Admitting: Surgery

## 2014-07-22 ENCOUNTER — Ambulatory Visit (HOSPITAL_COMMUNITY): Payer: BLUE CROSS/BLUE SHIELD | Admitting: Anesthesiology

## 2014-07-22 ENCOUNTER — Encounter (HOSPITAL_COMMUNITY): Payer: Self-pay

## 2014-07-22 DIAGNOSIS — K429 Umbilical hernia without obstruction or gangrene: Secondary | ICD-10-CM | POA: Insufficient documentation

## 2014-07-22 DIAGNOSIS — I10 Essential (primary) hypertension: Secondary | ICD-10-CM | POA: Insufficient documentation

## 2014-07-22 DIAGNOSIS — E291 Testicular hypofunction: Secondary | ICD-10-CM | POA: Diagnosis not present

## 2014-07-22 HISTORY — PX: UMBILICAL HERNIA REPAIR: SHX196

## 2014-07-22 HISTORY — PX: INSERTION OF MESH: SHX5868

## 2014-07-22 SURGERY — REPAIR, HERNIA, UMBILICAL, ADULT
Anesthesia: General | Site: Abdomen

## 2014-07-22 MED ORDER — LISINOPRIL 10 MG PO TABS
10.0000 mg | ORAL_TABLET | Freq: Every day | ORAL | Status: DC
  Administered 2014-07-22: 10 mg via ORAL
  Filled 2014-07-22: qty 1

## 2014-07-22 MED ORDER — MIDAZOLAM HCL 5 MG/5ML IJ SOLN
INTRAMUSCULAR | Status: DC | PRN
  Administered 2014-07-22: 2 mg via INTRAVENOUS

## 2014-07-22 MED ORDER — LACTATED RINGERS IV SOLN
INTRAVENOUS | Status: DC | PRN
  Administered 2014-07-22 (×2): via INTRAVENOUS

## 2014-07-22 MED ORDER — SUCCINYLCHOLINE CHLORIDE 20 MG/ML IJ SOLN
INTRAMUSCULAR | Status: AC
  Filled 2014-07-22: qty 1

## 2014-07-22 MED ORDER — OXYCODONE-ACETAMINOPHEN 5-325 MG PO TABS: 1.0000 | ORAL_TABLET | ORAL | Status: DC | PRN

## 2014-07-22 MED ORDER — GLYCOPYRROLATE 0.2 MG/ML IJ SOLN
INTRAMUSCULAR | Status: AC
  Filled 2014-07-22: qty 2

## 2014-07-22 MED ORDER — LIDOCAINE HCL (CARDIAC) 20 MG/ML IV SOLN
INTRAVENOUS | Status: AC
  Filled 2014-07-22: qty 15

## 2014-07-22 MED ORDER — ROCURONIUM BROMIDE 50 MG/5ML IV SOLN
INTRAVENOUS | Status: AC
  Filled 2014-07-22: qty 1

## 2014-07-22 MED ORDER — HYDROMORPHONE HCL 1 MG/ML IJ SOLN: 0.2500 mg | INTRAMUSCULAR | Status: DC | PRN

## 2014-07-22 MED ORDER — NEOSTIGMINE METHYLSULFATE 10 MG/10ML IV SOLN
INTRAVENOUS | Status: AC
  Filled 2014-07-22: qty 4

## 2014-07-22 MED ORDER — SODIUM CHLORIDE 0.9 % IJ SOLN
INTRAMUSCULAR | Status: AC
  Filled 2014-07-22: qty 10

## 2014-07-22 MED ORDER — EPHEDRINE SULFATE 50 MG/ML IJ SOLN
INTRAMUSCULAR | Status: AC
  Filled 2014-07-22: qty 1

## 2014-07-22 MED ORDER — ARTIFICIAL TEARS OP OINT
TOPICAL_OINTMENT | OPHTHALMIC | Status: AC
  Filled 2014-07-22: qty 3.5

## 2014-07-22 MED ORDER — BUPIVACAINE-EPINEPHRINE (PF) 0.25% -1:200000 IJ SOLN
INTRAMUSCULAR | Status: AC
  Filled 2014-07-22: qty 30

## 2014-07-22 MED ORDER — BUPIVACAINE-EPINEPHRINE 0.25% -1:200000 IJ SOLN
INTRAMUSCULAR | Status: DC | PRN
  Administered 2014-07-22: 10 mL

## 2014-07-22 MED ORDER — ONDANSETRON HCL 4 MG/2ML IJ SOLN
INTRAMUSCULAR | Status: DC | PRN
  Administered 2014-07-22: 4 mg via INTRAVENOUS

## 2014-07-22 MED ORDER — FENTANYL CITRATE (PF) 250 MCG/5ML IJ SOLN
INTRAMUSCULAR | Status: AC
  Filled 2014-07-22: qty 5

## 2014-07-22 MED ORDER — MORPHINE SULFATE 2 MG/ML IJ SOLN: 2.0000 mg | INTRAMUSCULAR | Status: DC | PRN

## 2014-07-22 MED ORDER — PROPOFOL 10 MG/ML IV BOLUS
INTRAVENOUS | Status: AC
  Filled 2014-07-22: qty 20

## 2014-07-22 MED ORDER — MIDAZOLAM HCL 2 MG/2ML IJ SOLN
INTRAMUSCULAR | Status: AC
  Filled 2014-07-22: qty 2

## 2014-07-22 MED ORDER — PROMETHAZINE HCL 25 MG/ML IJ SOLN: 6.2500 mg | INTRAMUSCULAR | Status: DC | PRN

## 2014-07-22 MED ORDER — FENTANYL CITRATE (PF) 250 MCG/5ML IJ SOLN
INTRAMUSCULAR | Status: DC | PRN
  Administered 2014-07-22: 100 ug via INTRAVENOUS

## 2014-07-22 MED ORDER — LIDOCAINE HCL (CARDIAC) 20 MG/ML IV SOLN
INTRAVENOUS | Status: DC | PRN
  Administered 2014-07-22: 100 mg via INTRAVENOUS

## 2014-07-22 MED ORDER — PROPOFOL 10 MG/ML IV BOLUS
INTRAVENOUS | Status: DC | PRN
  Administered 2014-07-22: 200 mg via INTRAVENOUS

## 2014-07-22 MED ORDER — 0.9 % SODIUM CHLORIDE (POUR BTL) OPTIME
TOPICAL | Status: DC | PRN
  Administered 2014-07-22: 1000 mL

## 2014-07-22 MED ORDER — MIDAZOLAM HCL 2 MG/2ML IJ SOLN: 0.5000 mg | Freq: Once | INTRAMUSCULAR | Status: DC | PRN

## 2014-07-22 MED ORDER — ONDANSETRON HCL 4 MG/2ML IJ SOLN
4.0000 mg | INTRAMUSCULAR | Status: DC | PRN
  Filled 2014-07-22: qty 2

## 2014-07-22 MED ORDER — ONDANSETRON HCL 4 MG/2ML IJ SOLN
INTRAMUSCULAR | Status: AC
  Filled 2014-07-22: qty 2

## 2014-07-22 MED ORDER — MEPERIDINE HCL 25 MG/ML IJ SOLN: 6.2500 mg | INTRAMUSCULAR | Status: DC | PRN

## 2014-07-22 SURGICAL SUPPLY — 50 items
APL SKNCLS STERI-STRIP NONHPOA (GAUZE/BANDAGES/DRESSINGS) ×1
BENZOIN TINCTURE PRP APPL 2/3 (GAUZE/BANDAGES/DRESSINGS) ×2 IMPLANT
BLADE SURG 10 STRL SS (BLADE) ×2 IMPLANT
BLADE SURG 15 STRL LF DISP TIS (BLADE) ×1 IMPLANT
BLADE SURG 15 STRL SS (BLADE) ×2
BLADE SURG ROTATE 9660 (MISCELLANEOUS) IMPLANT
CANISTER SUCTION 2500CC (MISCELLANEOUS) IMPLANT
CHLORAPREP W/TINT 26ML (MISCELLANEOUS) ×2 IMPLANT
COVER SURGICAL LIGHT HANDLE (MISCELLANEOUS) ×2 IMPLANT
DRAPE PED LAPAROTOMY (DRAPES) ×2 IMPLANT
DRAPE UTILITY XL STRL (DRAPES) ×2 IMPLANT
DRSG TEGADERM 2-3/8X2-3/4 SM (GAUZE/BANDAGES/DRESSINGS) ×1 IMPLANT
DRSG TEGADERM 4X4.75 (GAUZE/BANDAGES/DRESSINGS) ×1 IMPLANT
ELECT CAUTERY BLADE 6.4 (BLADE) ×2 IMPLANT
ELECT REM PT RETURN 9FT ADLT (ELECTROSURGICAL) ×2
ELECTRODE REM PT RTRN 9FT ADLT (ELECTROSURGICAL) ×1 IMPLANT
GAUZE SPONGE 2X2 8PLY STRL LF (GAUZE/BANDAGES/DRESSINGS) ×1 IMPLANT
GAUZE SPONGE 4X4 12PLY STRL (GAUZE/BANDAGES/DRESSINGS) ×1 IMPLANT
GAUZE SPONGE 4X4 16PLY XRAY LF (GAUZE/BANDAGES/DRESSINGS) ×2 IMPLANT
GLOVE BIO SURGEON STRL SZ7 (GLOVE) ×2 IMPLANT
GLOVE BIOGEL PI IND STRL 7.0 (GLOVE) IMPLANT
GLOVE BIOGEL PI IND STRL 7.5 (GLOVE) ×1 IMPLANT
GLOVE BIOGEL PI INDICATOR 7.0 (GLOVE) ×2
GLOVE BIOGEL PI INDICATOR 7.5 (GLOVE) ×1
GLOVE SURG SS PI 7.0 STRL IVOR (GLOVE) ×1 IMPLANT
GOWN STRL REUS W/ TWL LRG LVL3 (GOWN DISPOSABLE) ×2 IMPLANT
GOWN STRL REUS W/TWL LRG LVL3 (GOWN DISPOSABLE) ×4
KIT BASIN OR (CUSTOM PROCEDURE TRAY) ×2 IMPLANT
KIT ROOM TURNOVER OR (KITS) ×2 IMPLANT
MESH VENTRALEX ST 1-7/10 CRC S (Mesh General) ×1 IMPLANT
NDL HYPO 25GX1X1/2 BEV (NEEDLE) ×1 IMPLANT
NEEDLE HYPO 25GX1X1/2 BEV (NEEDLE) ×2 IMPLANT
NS IRRIG 1000ML POUR BTL (IV SOLUTION) ×2 IMPLANT
PACK SURGICAL SETUP 50X90 (CUSTOM PROCEDURE TRAY) ×2 IMPLANT
PAD ARMBOARD 7.5X6 YLW CONV (MISCELLANEOUS) ×2 IMPLANT
PENCIL BUTTON HOLSTER BLD 10FT (ELECTRODE) ×2 IMPLANT
SPONGE GAUZE 2X2 STER 10/PKG (GAUZE/BANDAGES/DRESSINGS) ×1
SPONGE LAP 18X18 X RAY DECT (DISPOSABLE) ×2 IMPLANT
STRIP CLOSURE SKIN 1/2X4 (GAUZE/BANDAGES/DRESSINGS) ×2 IMPLANT
SUT MNCRL AB 4-0 PS2 18 (SUTURE) ×2 IMPLANT
SUT NOVA NAB GS-21 0 18 T12 DT (SUTURE) ×2 IMPLANT
SUT NOVA NAB GS-21 1 T12 (SUTURE) ×2 IMPLANT
SUT VIC AB 3-0 SH 27 (SUTURE) ×2
SUT VIC AB 3-0 SH 27X BRD (SUTURE) ×1 IMPLANT
SYR BULB 3OZ (MISCELLANEOUS) ×2 IMPLANT
SYR CONTROL 10ML LL (SYRINGE) ×2 IMPLANT
TOWEL OR 17X24 6PK STRL BLUE (TOWEL DISPOSABLE) ×1 IMPLANT
TOWEL OR 17X26 10 PK STRL BLUE (TOWEL DISPOSABLE) ×2 IMPLANT
TUBE CONNECTING 12X1/4 (SUCTIONS) IMPLANT
YANKAUER SUCT BULB TIP NO VENT (SUCTIONS) IMPLANT

## 2014-07-22 NOTE — Anesthesia Postprocedure Evaluation (Signed)
  Anesthesia Post-op Note  Patient: Kevin Wilkinson  Procedure(s) Performed: Procedure(s): UMBILICAL HERNIA REPAIR WITH MESH (N/A) INSERTION OF MESH (N/A)  Patient Location: PACU  Anesthesia Type:General  Level of Consciousness: awake, alert , oriented, patient cooperative and responds to stimulation  Airway and Oxygen Therapy: Patient Spontanous Breathing  Post-op Pain: none  Post-op Assessment: Post-op Vital signs reviewed, Patient's Cardiovascular Status Stable, Respiratory Function Stable, Patent Airway, No signs of Nausea or vomiting and Pain level controlled  Post-op Vital Signs: Reviewed and stable  Last Vitals:  Filed Vitals:   07/22/14 1029  BP: 157/100  Pulse: 67  Temp:   Resp:     Complications: No apparent anesthesia complications

## 2014-07-22 NOTE — Anesthesia Procedure Notes (Signed)
Procedure Name: LMA Insertion Performed by: Mariea Clonts Pre-anesthesia Checklist: Patient identified, Timeout performed, Emergency Drugs available, Patient being monitored and Suction available Patient Re-evaluated:Patient Re-evaluated prior to inductionOxygen Delivery Method: Circle system utilized Preoxygenation: Pre-oxygenation with 100% oxygen Intubation Type: IV induction LMA: LMA inserted LMA Size: 4.5 Number of attempts: 1 Placement Confirmation: breath sounds checked- equal and bilateral and positive ETCO2 Tube secured with: Tape Dental Injury: Teeth and Oropharynx as per pre-operative assessment

## 2014-07-22 NOTE — Progress Notes (Signed)
Called for sign out

## 2014-07-22 NOTE — Consult Note (Signed)
History of Present Illness  Patient words: eval umb hernia.   Referred by Dr. Alysia Penna for evaluation of umbilical hernia This is a 44 year old male in good health who presents with a 3 year history of a small bowel, hernia. This has become larger and more uncomfortable. He was evaluated several years ago by a Psychologist, sport and exercise but at that time they chose not to repair it because it was quite small. Since it is becoming uncomfortable he presents now to discuss repair. He denies any obstructive symptoms. Other Problems  High blood pressure  Past Surgical History  Tonsillectomy  Diagnostic Studies History Colonoscopy never  Allergies  No Known Drug Allergies 04/27/2014  Medication History  Allopurinol (300MG  Tablet, Oral daily) Active. Diclofenac Sodium (75MG  Tablet DR, Oral daily) Active. Lisinopril (20MG  Tablet, Oral daily) Active.  Social History  Alcohol use Moderate alcohol use. Caffeine use Tea. No drug use Tobacco use Never smoker.  Family History  Diabetes Mellitus Father. Hypertension Father, Mother.     Review of Systems  General Not Present- Appetite Loss, Chills, Fatigue, Fever, Night Sweats, Weight Gain and Weight Loss. Skin Not Present- Change in Wart/Mole, Dryness, Hives, Jaundice, New Lesions, Non-Healing Wounds, Rash and Ulcer. HEENT Not Present- Earache, Hearing Loss, Hoarseness, Nose Bleed, Oral Ulcers, Ringing in the Ears, Seasonal Allergies, Sinus Pain, Sore Throat, Visual Disturbances, Wears glasses/contact lenses and Yellow Eyes. Respiratory Not Present- Bloody sputum, Chronic Cough, Difficulty Breathing, Snoring and Wheezing. Breast Not Present- Breast Mass, Breast Pain, Nipple Discharge and Skin Changes. Cardiovascular Not Present- Chest Pain, Difficulty Breathing Lying Down, Leg Cramps, Palpitations, Rapid Heart Rate, Shortness of Breath and Swelling of Extremities. Gastrointestinal Not Present- Abdominal Pain, Bloating, Bloody Stool,  Change in Bowel Habits, Chronic diarrhea, Constipation, Difficulty Swallowing, Excessive gas, Gets full quickly at meals, Hemorrhoids, Indigestion, Nausea, Rectal Pain and Vomiting. Male Genitourinary Not Present- Blood in Urine, Change in Urinary Stream, Frequency, Impotence, Nocturia, Painful Urination, Urgency and Urine Leakage. Musculoskeletal Not Present- Back Pain, Joint Pain, Joint Stiffness, Muscle Pain, Muscle Weakness and Swelling of Extremities. Neurological Not Present- Decreased Memory, Fainting, Headaches, Numbness, Seizures, Tingling, Tremor, Trouble walking and Weakness. Psychiatric Not Present- Anxiety, Bipolar, Change in Sleep Pattern, Depression, Fearful and Frequent crying. Endocrine Not Present- Cold Intolerance, Excessive Hunger, Hair Changes, Heat Intolerance, Hot flashes and New Diabetes. Hematology Not Present- Easy Bruising, Excessive bleeding, Gland problems, HIV and Persistent Infections.  Vitals  04/27/2014 3:21 PM Weight: 216.8 lb Height: 72in Body Surface Area: 2.24 m Body Mass Index: 29.4 kg/m Temp.: 98.31F(Oral)  Pulse: 68 (Regular)  Resp.: 16 (Unlabored)  BP: 132/88 (Sitting, Left Arm, Standard)     Physical Exam   The physical exam findings are as follows: Note:WDWN in NAD HEENT: EOMI, sclera anicteric Neck: No masses, no thyromegaly Lungs: CTA bilaterally; normal respiratory effort CV: Regular rate and rhythm; no murmurs Abd: +bowel sounds, soft, protruding umbilical hernia - reducible Ext: Well-perfused; no edema Skin: Warm, dry; no sign of jaundice    Assessment & Plan    UMBILICAL HERNIA WITHOUT OBSTRUCTION AND WITHOUT GANGRENE (553.1  K42.9)  Current Plans Schedule for Surgery - umbilical hernia repair with mesh. The surgical procedure has been discussed with the patient. Potential risks, benefits, alternative treatments, and expected outcomes have been explained. All of the patient's questions at this time have been  answered. The likelihood of reaching the patient's treatment goal is good. The patient understand the proposed surgical procedure and wishes to proceed.   Imogene Burn. Georgette Dover, MD, [facility]  Surgery  General/ Trauma Surgery  07/22/2014 7:02 AM

## 2014-07-22 NOTE — Discharge Instructions (Signed)
[facility] ° °UMBILICAL HERNIA REPAIR: POST OP INSTRUCTIONS ° °Always review your discharge instruction sheet given to you by the facility where your surgery was performed. °IF YOU HAVE DISABILITY OR FAMILY LEAVE FORMS, YOU MUST BRING THEM TO THE OFFICE FOR PROCESSING.   °DO NOT GIVE THEM TO YOUR DOCTOR. ° °1. A  prescription for pain medication may be given to you upon discharge.  Take your pain medication as prescribed, if needed.  If narcotic pain medicine is not needed, then you may take acetaminophen (Tylenol) or ibuprofen (Advil) as needed. °2. Take your usually prescribed medications unless otherwise directed. °3. If you need a refill on your pain medication, please contact your pharmacy.  They will contact our office to request authorization. Prescriptions will not be filled after 5 pm or on week-ends. °4. You should follow a light diet the first 24 hours after arrival home, such as soup and crackers, etc.  Be sure to include lots of fluids daily.  Resume your normal diet the day after surgery. °5. Most patients will experience some swelling and bruising around the umbilicus or in the groin and scrotum.  Ice packs and reclining will help.  Swelling and bruising can take several days to resolve.  °6. It is common to experience some constipation if taking pain medication after surgery.  Increasing fluid intake and taking a stool softener (such as Colace) will usually help or prevent this problem from occurring.  A mild laxative (Milk of Magnesia or Miralax) should be taken according to package directions if there are no bowel movements after 48 hours. °7. Unless discharge instructions indicate otherwise, you may remove your bandages 24-48 hours after surgery, and you may shower at that time.  You will have steri-strips (small skin tapes) in place directly over the incision.  These strips should be left on the skin for 7-10 days. °8. ACTIVITIES:  You may resume regular (light) daily activities  beginning the next day--such as daily self-care, walking, climbing stairs--gradually increasing activities as tolerated.  You may have sexual intercourse when it is comfortable.  Refrain from any heavy lifting or straining until approved by your doctor. °a. You may drive when you are no longer taking prescription pain medication, you can comfortably wear a seatbelt, and you can safely maneuver your car and apply brakes. °b. RETURN TO WORK:  2-3 weeks with light duty - no lifting over 15 lbs. °9. You should see your doctor in the office for a follow-up appointment approximately 2-3 weeks after your surgery.  Make sure that you call for this appointment within a day or two after you arrive home to insure a convenient appointment time. °10. OTHER INSTRUCTIONS:  __________________________________________________________________________________________________________________________________________________________________________________________  °WHEN TO CALL YOUR DOCTOR: °1. Fever over 101.0 °2. Inability to urinate °3. Nausea and/or vomiting °4. Extreme swelling or bruising °5. Continued bleeding from incision. °6. Increased pain, redness, or drainage from the incision ° °The clinic staff is available to answer your questions during regular business hours.  Please don’t hesitate to call and ask to speak to one of the nurses for clinical concerns.  If you have a medical emergency, go to the nearest emergency room or call 911.  A surgeon from [facility] is always on call at the hospital ° ° °[address], [address], [place], [place]  [address] ? ° [address] [address], [place], [place]   [address] °[phone]    [phone]    FAX [phone] °Web site: [email or web address] ° ° °

## 2014-07-22 NOTE — Progress Notes (Signed)
Dr. Glennon Mac notified of pt's cont HTN. Orders received to give pt home dose of Lisinopril in pacu. Will cont to monitor.

## 2014-07-22 NOTE — Transfer of Care (Signed)
Immediate Anesthesia Transfer of Care Note  Patient: Kevin Wilkinson  Procedure(s) Performed: Procedure(s): UMBILICAL HERNIA REPAIR WITH MESH (N/A) INSERTION OF MESH (N/A)  Patient Location: PACU  Anesthesia Type:General  Level of Consciousness: awake, alert  and oriented  Airway & Oxygen Therapy: Patient Spontanous Breathing and Patient connected to nasal cannula oxygen  Post-op Assessment: Report given to RN and Post -op Vital signs reviewed and stable  Post vital signs: Reviewed and stable  Last Vitals:  Filed Vitals:   07/22/14 0706  BP: 137/106  Pulse:   Temp:   Resp:     Complications: No apparent anesthesia complications

## 2014-07-22 NOTE — Op Note (Signed)
Indications:  The patient presented with a history of an enlarging, partially reducible umbilical hernia.  The patient was examined and we recommended umbilical hernia repair with mesh.  Pre-operative diagnosis:  Umbilical hernia  Post-operative diagnosis:  Same  Surgeon: Shelina Luo K.   Assistants: none  Anesthesia: General LMA anesthesia  ASA Class: 1   Procedure Details  The patient was seen again in the Holding Room. The risks, benefits, complications, treatment options, and expected outcomes were discussed with the patient. The possibilities of reaction to medication, pulmonary aspiration, perforation of viscus, bleeding, recurrent infection, the need for additional procedures, and development of a complication requiring transfusion or further operation were discussed with the patient and/or family. There was concurrence with the proposed plan, and informed consent was obtained. The site of surgery was properly noted/marked. The patient was taken to the Operating Room, identified as Kevin Wilkinson, and the procedure verified as umbilical hernia repair. A Time Out was held and the above information confirmed.  After an adequate level of general anesthesia was obtained, the patient's abdomen was prepped with Chloraprep and draped in sterile fashion.  We made a transverse incision below the umbilicus.  Dissection was carried down to the hernia sac with cautery.  We dissected bluntly around the hernia sac down to the edge of the fascial defect.  We reduced the hernia sac back into the pre-peritoneal space.  The fascial defect measured 1.5.  We cleared the fascia in all directions.  A small Ventralex mesh was inserted into the pre-peritoneal space and was deployed.  The mesh was secured with four trans-fascial sutures of 0 Novofil.  The fascial defect was closed with multiple interrupted figure-of-eight 1 Novofil sutures.  The base of the umbilicus was tacked down with 3-0 Vicryl.  3-0 Vicryl  was used to close the subcutaneous tissues and 4-0 Monocryl was used to close the skin.  Steri-strips and clean dressing were applied.  The patient was extubated and brought to the recovery room in stable condition.  All sponge, instrument, and needle counts were correct prior to closure and at the conclusion of the case.   Estimated Blood Loss: Minimal          Complications: None; patient tolerated the procedure well.         Disposition: PACU - hemodynamically stable.         Condition: stable  Imogene Burn. Georgette Dover, MD, [facility]  General/ Trauma Surgery  07/22/2014 8:04 AM

## 2014-07-22 NOTE — Anesthesia Preprocedure Evaluation (Addendum)
Anesthesia Evaluation  Patient identified by MRN, date of birth, ID band Patient awake    Reviewed: Allergy & Precautions, NPO status , Patient's Chart, lab work & pertinent test results  History of Anesthesia Complications Negative for: history of anesthetic complications  Airway Mallampati: II  TM Distance: >3 FB Neck ROM: Full    Dental  (+) Teeth Intact, Dental Advisory Given   Pulmonary neg pulmonary ROS,  breath sounds clear to auscultation        Cardiovascular hypertension, Pt. on medications Rhythm:Regular Rate:Normal     Neuro/Psych negative neurological ROS     GI/Hepatic negative GI ROS, Neg liver ROS,   Endo/Other  negative endocrine ROS  Renal/GU negative Renal ROS     Musculoskeletal   Abdominal   Peds  Hematology negative hematology ROS (+)   Anesthesia Other Findings   Reproductive/Obstetrics                            Anesthesia Physical Anesthesia Plan  ASA: II  Anesthesia Plan: General   Post-op Pain Management:    Induction: Intravenous  Airway Management Planned: Oral ETT  Additional Equipment:   Intra-op Plan:   Post-operative Plan: Extubation in OR  Informed Consent: I have reviewed the patients History and Physical, chart, labs and discussed the procedure including the risks, benefits and alternatives for the proposed anesthesia with the patient or authorized representative who has indicated his/her understanding and acceptance.   Dental advisory given  Plan Discussed with: CRNA and Surgeon  Anesthesia Plan Comments: (Plan routine monitors, GETA)        Anesthesia Quick Evaluation

## 2014-07-23 ENCOUNTER — Encounter (HOSPITAL_COMMUNITY): Payer: Self-pay | Admitting: Surgery

## 2015-01-10 ENCOUNTER — Encounter: Payer: Self-pay | Admitting: Family Medicine

## 2015-01-10 ENCOUNTER — Ambulatory Visit (INDEPENDENT_AMBULATORY_CARE_PROVIDER_SITE_OTHER): Payer: BLUE CROSS/BLUE SHIELD | Admitting: Family Medicine

## 2015-01-10 VITALS — BP 155/92 | HR 74 | Temp 98.5°F | Ht 72.0 in | Wt 216.0 lb

## 2015-01-10 DIAGNOSIS — I10 Essential (primary) hypertension: Secondary | ICD-10-CM | POA: Diagnosis not present

## 2015-01-10 DIAGNOSIS — J019 Acute sinusitis, unspecified: Secondary | ICD-10-CM | POA: Diagnosis not present

## 2015-01-10 MED ORDER — AZITHROMYCIN 250 MG PO TABS: ORAL_TABLET | ORAL | Status: DC

## 2015-01-10 MED ORDER — LISINOPRIL-HYDROCHLOROTHIAZIDE 20-25 MG PO TABS: 1.0000 | ORAL_TABLET | Freq: Every day | ORAL | Status: DC

## 2015-01-10 NOTE — Progress Notes (Signed)
Pre visit review using our clinic review tool, if applicable. No additional management support is needed unless otherwise documented below in the visit note. 

## 2015-01-10 NOTE — Progress Notes (Signed)
   Subjective:    Patient ID: Kevin Wilkinson, male    DOB: [birth year], 44 y.o.   MRN: [id number]  HPI Here for one week of sinus pressure, PND, ST, and a dry cough. We also note that his BP has been elevated for some time now. He takes Lisinopril.   Review of Systems  Constitutional: Negative.   HENT: Positive for congestion, sinus pressure and sore throat.   Eyes: Negative.   Respiratory: Positive for cough.   Cardiovascular: Negative.        Objective:   Physical Exam  Constitutional: He is oriented to person, place, and time. He appears well-developed and well-nourished.  HENT:  Right Ear: External ear normal.  Left Ear: External ear normal.  Nose: Nose normal.  Mouth/Throat: Oropharynx is clear and moist.  Eyes: Conjunctivae are normal.  Cardiovascular: Normal rate, regular rhythm, normal heart sounds and intact distal pulses.   Pulmonary/Chest: Effort normal and breath sounds normal.  Musculoskeletal: He exhibits no edema.  Lymphadenopathy:    He has no cervical adenopathy.  Neurological: He is alert and oriented to person, place, and time.          Assessment & Plan:  For the sinusitis, treat with a Zpack. For the HTN, change to Lisinopril HCT. Recheck in 3 weeks

## 2015-02-11 ENCOUNTER — Ambulatory Visit (INDEPENDENT_AMBULATORY_CARE_PROVIDER_SITE_OTHER): Payer: BLUE CROSS/BLUE SHIELD | Admitting: Psychology

## 2015-02-11 DIAGNOSIS — F4323 Adjustment disorder with mixed anxiety and depressed mood: Secondary | ICD-10-CM | POA: Diagnosis not present

## 2015-02-25 ENCOUNTER — Ambulatory Visit (INDEPENDENT_AMBULATORY_CARE_PROVIDER_SITE_OTHER): Payer: BLUE CROSS/BLUE SHIELD | Admitting: Psychology

## 2015-02-25 DIAGNOSIS — F4323 Adjustment disorder with mixed anxiety and depressed mood: Secondary | ICD-10-CM | POA: Diagnosis not present

## 2015-02-27 ENCOUNTER — Other Ambulatory Visit: Payer: Self-pay | Admitting: Family Medicine

## 2015-02-28 ENCOUNTER — Other Ambulatory Visit: Payer: Self-pay | Admitting: Family Medicine

## 2015-02-28 MED ORDER — LISINOPRIL-HYDROCHLOROTHIAZIDE 20-25 MG PO TABS: 1.0000 | ORAL_TABLET | Freq: Every day | ORAL | Status: DC

## 2015-04-02 ENCOUNTER — Other Ambulatory Visit: Payer: Self-pay | Admitting: Family Medicine

## 2015-05-06 ENCOUNTER — Encounter: Payer: Self-pay | Admitting: Podiatry

## 2015-05-06 ENCOUNTER — Ambulatory Visit (INDEPENDENT_AMBULATORY_CARE_PROVIDER_SITE_OTHER): Payer: BLUE CROSS/BLUE SHIELD | Admitting: Podiatry

## 2015-05-06 VITALS — BP 129/87 | HR 81 | Resp 16 | Ht 72.0 in | Wt 209.0 lb

## 2015-05-06 DIAGNOSIS — L600 Ingrowing nail: Secondary | ICD-10-CM | POA: Diagnosis not present

## 2015-05-06 NOTE — Progress Notes (Signed)
   Subjective:    Patient ID: Kevin Wilkinson, male    DOB: 11-19-1970, 45 y.o.   MRN: [id number]  HPI Patient presents with a nail problem in their right foot; great toe; split in nail. Pt stated, "split in nail and needs to have check and wants to know what options for nail are"; x2 yrs.   Review of Systems  All other systems reviewed and are negative.      Objective:   Physical Exam        Assessment & Plan:

## 2015-05-09 NOTE — Progress Notes (Signed)
Subjective:     Patient ID: Kevin Wilkinson, male   DOB: 1971-03-21, 45 y.o.   MRN: [id number]  HPI patient states my right big toenail is split and it's been this way for a number of years and I've lost the nail several times. Do not remember a specific injury   Review of Systems  All other systems reviewed and are negative.      Objective:   Physical Exam  Constitutional: He is oriented to person, place, and time.  Cardiovascular: Intact distal pulses.   Musculoskeletal: Normal range of motion.  Neurological: He is oriented to person, place, and time.  Skin: Skin is warm.  Nursing note and vitals reviewed.  neurovascular status intact muscle strength adequate range of motion within normal limits with patient found to have a split in the hallux nail right on the medial one third of the nail that extends to the base of the nailplate. It is not painful currently but discolored with abnormal growth pattern noted. Good digital perfusion and patient is well oriented 3     Assessment:     Damaged right hallux nail secondary to probable incident of trauma that is program to grow in this way    Plan:     H&P and condition reviewed. Since this has not caused pain or infection I've recommended we just watch it and continue to smooth the nail down for the best cosmetic result possible someday nail removal or partial nail removal may be necessary but I do not recommend at the current time

## 2015-05-15 ENCOUNTER — Other Ambulatory Visit: Payer: Self-pay | Admitting: Family Medicine

## 2015-08-16 ENCOUNTER — Other Ambulatory Visit: Payer: Self-pay | Admitting: Family Medicine

## 2015-10-04 ENCOUNTER — Other Ambulatory Visit: Payer: Self-pay | Admitting: Family Medicine

## 2015-10-04 NOTE — Telephone Encounter (Signed)
Rx refill sent to pharmacy. 

## 2016-03-07 ENCOUNTER — Other Ambulatory Visit: Payer: Self-pay | Admitting: Family Medicine

## 2016-03-12 ENCOUNTER — Other Ambulatory Visit: Payer: Self-pay | Admitting: Family Medicine

## 2016-06-06 LAB — CBC AND DIFFERENTIAL
HCT: 49 % (ref 41–53)
HEMOGLOBIN: 17.3 g/dL (ref 13.5–17.5)
Platelets: 213 10*3/uL (ref 150–399)
WBC: 6.6 10^3/mL

## 2016-06-06 LAB — BASIC METABOLIC PANEL
BUN: 21 mg/dL (ref 4–21)
Creatinine: 0.8 mg/dL (ref <=1.3)
GLUCOSE: 113 mg/dL
POTASSIUM: 4.7 mmol/L (ref 3.4–5.3)
Sodium: 138 mmol/L (ref 137–147)

## 2016-06-06 LAB — TSH: TSH: 2.04 u[IU]/mL (ref <=5.90)

## 2016-06-06 LAB — HEPATIC FUNCTION PANEL
ALT: 41 U/L — AB (ref 10–40)
AST: 28 U/L (ref 14–40)
Alkaline Phosphatase: 62 U/L (ref 25–125)

## 2016-06-06 LAB — HEMOGLOBIN A1C: Hemoglobin A1C: 5.1

## 2016-06-06 LAB — LIPID PANEL
CHOLESTEROL: 328 mg/dL — AB (ref 0–200)
HDL: 37 mg/dL (ref 35–70)
TRIGLYCERIDES: 649 mg/dL — AB (ref 40–160)

## 2016-06-06 LAB — VITAMIN D 25 HYDROXY (VIT D DEFICIENCY, FRACTURES): VIT D 25 HYDROXY: 17.7

## 2016-06-07 ENCOUNTER — Encounter: Payer: Self-pay | Admitting: Family Medicine

## 2016-06-09 ENCOUNTER — Other Ambulatory Visit: Payer: Self-pay | Admitting: Family Medicine

## 2016-06-13 DIAGNOSIS — N5201 Erectile dysfunction due to arterial insufficiency: Secondary | ICD-10-CM | POA: Diagnosis not present

## 2016-06-13 DIAGNOSIS — E291 Testicular hypofunction: Secondary | ICD-10-CM | POA: Diagnosis not present

## 2016-06-14 ENCOUNTER — Other Ambulatory Visit: Payer: Self-pay | Admitting: Family Medicine

## 2016-07-06 ENCOUNTER — Ambulatory Visit (INDEPENDENT_AMBULATORY_CARE_PROVIDER_SITE_OTHER): Payer: BLUE CROSS/BLUE SHIELD | Admitting: Family Medicine

## 2016-07-06 ENCOUNTER — Encounter: Payer: Self-pay | Admitting: Family Medicine

## 2016-07-06 VITALS — BP 120/79 | HR 70 | Temp 97.8°F | Ht 72.0 in | Wt 210.0 lb

## 2016-07-06 DIAGNOSIS — M1000 Idiopathic gout, unspecified site: Secondary | ICD-10-CM | POA: Diagnosis not present

## 2016-07-06 DIAGNOSIS — Z0000 Encounter for general adult medical examination without abnormal findings: Secondary | ICD-10-CM | POA: Diagnosis not present

## 2016-07-06 DIAGNOSIS — M109 Gout, unspecified: Secondary | ICD-10-CM | POA: Insufficient documentation

## 2016-07-06 LAB — BASIC METABOLIC PANEL
BUN: 20 mg/dL (ref 6–23)
CO2: 30 mEq/L (ref 19–32)
Calcium: 9.6 mg/dL (ref 8.4–10.5)
Chloride: 101 mEq/L (ref 96–112)
Creatinine, Ser: 1.03 mg/dL (ref 0.40–1.50)
GFR: 82.31 mL/min (ref >60.00)
Glucose, Bld: 96 mg/dL (ref 70–99)
Potassium: 4.4 mEq/L (ref 3.5–5.1)
SODIUM: 137 meq/L (ref 135–145)

## 2016-07-06 LAB — CBC WITH DIFFERENTIAL/PLATELET
BASOS PCT: 0.8 % (ref 0.0–3.0)
Basophils Absolute: 0 10*3/uL (ref 0.0–0.1)
Eosinophils Absolute: 0.1 10*3/uL (ref 0.0–0.7)
Eosinophils Relative: 2.5 % (ref 0.0–5.0)
HEMATOCRIT: 46.1 % (ref 39.0–52.0)
HEMOGLOBIN: 16.2 g/dL (ref 13.0–17.0)
LYMPHS PCT: 21.3 % (ref 12.0–46.0)
Lymphs Abs: 1.3 10*3/uL (ref 0.7–4.0)
MCHC: 35.1 g/dL (ref 30.0–36.0)
MCV: 91.4 fl (ref 78.0–100.0)
Monocytes Absolute: 0.7 10*3/uL (ref 0.1–1.0)
Monocytes Relative: 12 % (ref 3.0–12.0)
Neutro Abs: 3.8 10*3/uL (ref 1.4–7.7)
Neutrophils Relative %: 63.4 % (ref 43.0–77.0)
PLATELETS: 222 10*3/uL (ref 150.0–400.0)
RBC: 5.04 Mil/uL (ref 4.22–5.81)
RDW: 13 % (ref 11.5–15.5)
WBC: 6 10*3/uL (ref 4.0–10.5)

## 2016-07-06 LAB — LIPID PANEL
CHOL/HDL RATIO: 7
CHOLESTEROL: 253 mg/dL — AB (ref 0–200)
HDL: 36.9 mg/dL — ABNORMAL LOW (ref >39.00)
NonHDL: 215.73
Triglycerides: 259 mg/dL — ABNORMAL HIGH (ref 0.0–149.0)
VLDL: 51.8 mg/dL — AB (ref 0.0–40.0)

## 2016-07-06 LAB — URIC ACID: Uric Acid, Serum: 9.3 mg/dL — ABNORMAL HIGH (ref 4.0–7.8)

## 2016-07-06 LAB — HEPATIC FUNCTION PANEL
ALK PHOS: 38 U/L — AB (ref 39–117)
ALT: 25 U/L (ref 0–53)
AST: 22 U/L (ref 0–37)
Albumin: 4.5 g/dL (ref 3.5–5.2)
Bilirubin, Direct: 0.1 mg/dL (ref 0.0–0.3)
Total Bilirubin: 1 mg/dL (ref 0.2–1.2)
Total Protein: 6.6 g/dL (ref 6.0–8.3)

## 2016-07-06 LAB — POC URINALSYSI DIPSTICK (AUTOMATED)
BILIRUBIN UA: NEGATIVE
Blood, UA: NEGATIVE
GLUCOSE UA: NEGATIVE
Ketones, UA: NEGATIVE
Leukocytes, UA: NEGATIVE
NITRITE UA: NEGATIVE
Protein, UA: NEGATIVE
Spec Grav, UA: 1.015 (ref 1.010–1.025)
UROBILINOGEN UA: 0.2 U/dL
pH, UA: 6 (ref 5.0–8.0)

## 2016-07-06 LAB — TSH: TSH: 1.75 u[IU]/mL (ref 0.35–4.50)

## 2016-07-06 LAB — LDL CHOLESTEROL, DIRECT: Direct LDL: 145 mg/dL

## 2016-07-06 NOTE — Progress Notes (Signed)
Pre visit review using our clinic review tool, if applicable. No additional management support is needed unless otherwise documented below in the visit note. 

## 2016-07-06 NOTE — Patient Instructions (Signed)
WE NOW OFFER   Kevin Wilkinson's FAST TRACK!!!  SAME DAY Appointments for ACUTE CARE  Such as: Sprains, Injuries, cuts, abrasions, rashes, muscle pain, joint pain, back pain Colds, flu, sore throats, headache, allergies, cough, fever  Ear pain, sinus and eye infections Abdominal pain, nausea, vomiting, diarrhea, upset stomach Animal/insect bites  3 Easy Ways to Schedule: Walk-In Scheduling Call in scheduling Mychart Sign-up: [email or web address]         

## 2016-07-06 NOTE — Progress Notes (Signed)
   Subjective:    Patient ID: Kevin Wilkinson, male    DOB: 21-May-1970, 46 y.o.   MRN: [id number]  HPI 46 yr old male for a well exam. He feels fine. He as lost 18 lbs in the past year.    Review of Systems  Constitutional: Negative.   HENT: Negative.   Eyes: Negative.   Respiratory: Negative.   Cardiovascular: Negative.   Gastrointestinal: Negative.   Genitourinary: Negative.   Musculoskeletal: Negative.   Skin: Negative.   Neurological: Negative.   Psychiatric/Behavioral: Negative.        Objective:   Physical Exam  Constitutional: He is oriented to person, place, and time. He appears well-developed and well-nourished. No distress.  HENT:  Head: Normocephalic and atraumatic.  Right Ear: External ear normal.  Left Ear: External ear normal.  Nose: Nose normal.  Mouth/Throat: Oropharynx is clear and moist. No oropharyngeal exudate.  Eyes: Conjunctivae and EOM are normal. Pupils are equal, round, and reactive to light. Right eye exhibits no discharge. Left eye exhibits no discharge. No scleral icterus.  Neck: Neck supple. No JVD present. No tracheal deviation present. No thyromegaly present.  Cardiovascular: Normal rate, regular rhythm, normal heart sounds and intact distal pulses.  Exam reveals no gallop and no friction rub.   No murmur heard. Pulmonary/Chest: Effort normal and breath sounds normal. No respiratory distress. He has no wheezes. He has no rales. He exhibits no tenderness.  Abdominal: Soft. Bowel sounds are normal. He exhibits no distension and no mass. There is no tenderness. There is no rebound and no guarding.  Genitourinary: Rectum normal, prostate normal and penis normal. Rectal exam shows guaiac negative stool. No penile tenderness.  Musculoskeletal: Normal range of motion. He exhibits no edema or tenderness.  Lymphadenopathy:    He has no cervical adenopathy.  Neurological: He is alert and oriented to person, place, and time. He has normal reflexes. No  cranial nerve deficit. He exhibits normal muscle tone. Coordination normal.  Skin: Skin is warm and dry. No rash noted. He is not diaphoretic. No erythema. No pallor.  Psychiatric: He has a normal mood and affect. His behavior is normal. Judgment and thought content normal.          Assessment & Plan:  Well exam. We discussed diet and exercise.  Alysia Penna, MD

## 2016-07-09 ENCOUNTER — Encounter: Payer: Self-pay | Admitting: Family Medicine

## 2016-07-10 NOTE — Telephone Encounter (Signed)
I spoke with pt and went over recent lab results.  

## 2016-07-23 ENCOUNTER — Other Ambulatory Visit: Payer: Self-pay | Admitting: Family Medicine

## 2016-07-23 ENCOUNTER — Encounter: Payer: Self-pay | Admitting: Family Medicine

## 2016-07-23 MED ORDER — ATORVASTATIN CALCIUM 20 MG PO TABS: 20.0000 mg | ORAL_TABLET | Freq: Every day | ORAL | 3 refills | Status: DC

## 2016-07-23 NOTE — Telephone Encounter (Signed)
I would suggest 20 mg of Lipitor daily

## 2016-07-24 NOTE — Telephone Encounter (Signed)
Can we refill this? Looks like old script.

## 2016-12-11 ENCOUNTER — Other Ambulatory Visit: Payer: Self-pay | Admitting: Family Medicine

## 2016-12-13 ENCOUNTER — Encounter: Payer: Self-pay | Admitting: Family Medicine

## 2017-02-21 DIAGNOSIS — E291 Testicular hypofunction: Secondary | ICD-10-CM | POA: Diagnosis not present

## 2017-02-28 DIAGNOSIS — E291 Testicular hypofunction: Secondary | ICD-10-CM | POA: Diagnosis not present

## 2017-02-28 DIAGNOSIS — N5201 Erectile dysfunction due to arterial insufficiency: Secondary | ICD-10-CM | POA: Diagnosis not present

## 2017-05-29 DIAGNOSIS — M21619 Bunion of unspecified foot: Secondary | ICD-10-CM | POA: Diagnosis not present

## 2017-05-29 DIAGNOSIS — M109 Gout, unspecified: Secondary | ICD-10-CM | POA: Diagnosis not present

## 2017-06-12 ENCOUNTER — Other Ambulatory Visit: Payer: Self-pay | Admitting: Family Medicine

## 2017-07-10 ENCOUNTER — Other Ambulatory Visit: Payer: Self-pay | Admitting: Family Medicine

## 2017-08-29 DIAGNOSIS — E291 Testicular hypofunction: Secondary | ICD-10-CM | POA: Diagnosis not present

## 2017-08-29 DIAGNOSIS — Z125 Encounter for screening for malignant neoplasm of prostate: Secondary | ICD-10-CM | POA: Diagnosis not present

## 2017-09-16 DIAGNOSIS — E291 Testicular hypofunction: Secondary | ICD-10-CM | POA: Diagnosis not present

## 2017-09-16 DIAGNOSIS — N5201 Erectile dysfunction due to arterial insufficiency: Secondary | ICD-10-CM | POA: Diagnosis not present

## 2017-10-30 DIAGNOSIS — M79671 Pain in right foot: Secondary | ICD-10-CM | POA: Diagnosis not present

## 2017-12-19 ENCOUNTER — Other Ambulatory Visit: Payer: Self-pay | Admitting: Family Medicine

## 2018-02-02 ENCOUNTER — Other Ambulatory Visit: Payer: Self-pay | Admitting: Family Medicine

## 2018-02-06 NOTE — Telephone Encounter (Signed)
Rx denied. Pt has not been seen in office since 07/23/2016. Pharmacy will advise pt he needs OV for more refills.

## 2018-03-10 DIAGNOSIS — Z125 Encounter for screening for malignant neoplasm of prostate: Secondary | ICD-10-CM | POA: Diagnosis not present

## 2018-03-13 ENCOUNTER — Other Ambulatory Visit: Payer: Self-pay | Admitting: Family Medicine

## 2018-03-20 DIAGNOSIS — N5201 Erectile dysfunction due to arterial insufficiency: Secondary | ICD-10-CM | POA: Diagnosis not present

## 2018-03-20 DIAGNOSIS — E291 Testicular hypofunction: Secondary | ICD-10-CM | POA: Diagnosis not present

## 2018-04-25 ENCOUNTER — Other Ambulatory Visit: Payer: Self-pay | Admitting: Family Medicine

## 2018-06-04 ENCOUNTER — Emergency Department (HOSPITAL_COMMUNITY)
Admission: EM | Admit: 2018-06-04 | Discharge: 2018-06-05 | Disposition: A | Discharge status: Home / Self Care | Payer: BLUE CROSS/BLUE SHIELD | Attending: Emergency Medicine | Admitting: Emergency Medicine

## 2018-06-04 ENCOUNTER — Encounter (HOSPITAL_COMMUNITY): Payer: Self-pay | Admitting: Emergency Medicine

## 2018-06-04 DIAGNOSIS — I10 Essential (primary) hypertension: Secondary | ICD-10-CM | POA: Insufficient documentation

## 2018-06-04 DIAGNOSIS — Y998 Other external cause status: Secondary | ICD-10-CM | POA: Insufficient documentation

## 2018-06-04 DIAGNOSIS — Y9322 Activity, ice hockey: Secondary | ICD-10-CM | POA: Insufficient documentation

## 2018-06-04 DIAGNOSIS — W21210A Struck by ice hockey stick, initial encounter: Secondary | ICD-10-CM | POA: Diagnosis not present

## 2018-06-04 DIAGNOSIS — Z79899 Other long term (current) drug therapy: Secondary | ICD-10-CM | POA: Diagnosis not present

## 2018-06-04 DIAGNOSIS — S0181XA Laceration without foreign body of other part of head, initial encounter: Secondary | ICD-10-CM

## 2018-06-04 DIAGNOSIS — Y92330 Ice skating rink (indoor) (outdoor) as the place of occurrence of the external cause: Secondary | ICD-10-CM | POA: Diagnosis not present

## 2018-06-04 DIAGNOSIS — S01511A Laceration without foreign body of lip, initial encounter: Secondary | ICD-10-CM | POA: Insufficient documentation

## 2018-06-04 DIAGNOSIS — Z23 Encounter for immunization: Secondary | ICD-10-CM | POA: Diagnosis not present
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
## 2018-06-05 ENCOUNTER — Other Ambulatory Visit: Payer: Self-pay

## 2018-06-05 MED ORDER — CLINDAMYCIN HCL 150 MG PO CAPS: 300.0000 mg | ORAL_CAPSULE | Freq: Three times a day (TID) | ORAL | 0 refills | Status: DC

## 2018-06-05 MED ORDER — LIDOCAINE-EPINEPHRINE (PF) 2 %-1:200000 IJ SOLN
10.0000 mL | Freq: Once | INTRAMUSCULAR | Status: DC
  Filled 2018-06-05: qty 20

## 2018-06-05 MED ORDER — TETANUS-DIPHTH-ACELL PERTUSSIS 5-2.5-18.5 LF-MCG/0.5 IM SUSP
0.5000 mL | Freq: Once | INTRAMUSCULAR | Status: AC
  Administered 2018-06-05: 0.5 mL via INTRAMUSCULAR
  Filled 2018-06-05: qty 0.5

## 2018-06-05 NOTE — Discharge Instructions (Signed)
You need to have the suture in your chin removed in 5 days.  The sutures on the inside of your mouth will dissolve on their own. Return to the ER for worsening symptoms.  Take antibiotics as prescribed.
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
## 2018-07-23 ENCOUNTER — Other Ambulatory Visit: Payer: Self-pay | Admitting: Family Medicine

## 2018-09-12 DIAGNOSIS — Z125 Encounter for screening for malignant neoplasm of prostate: Secondary | ICD-10-CM | POA: Diagnosis not present

## 2018-09-12 DIAGNOSIS — E291 Testicular hypofunction: Secondary | ICD-10-CM | POA: Diagnosis not present

## 2018-09-19 DIAGNOSIS — N5201 Erectile dysfunction due to arterial insufficiency: Secondary | ICD-10-CM | POA: Diagnosis not present

## 2018-09-19 DIAGNOSIS — E291 Testicular hypofunction: Secondary | ICD-10-CM | POA: Diagnosis not present

## 2018-11-17 ENCOUNTER — Telehealth: Payer: Self-pay

## 2018-11-17 NOTE — Telephone Encounter (Signed)
Copied from [place]. Topic: Referral - Request for Referral >> Nov 17, 2018  1:00 PM Ivar Drape wrote: Has patient seen PCP for this complaint?   NO *If NO, is insurance requiring patient see PCP for this issue before PCP can refer them? Referral for which specialty:  ENT Preferred provider/office:  NO Reason for referral:  History of having problems with his left ear

## 2018-11-18 NOTE — Telephone Encounter (Signed)
Left a detailed message on verified voice mail.   

## 2018-11-18 NOTE — Telephone Encounter (Signed)
Make an in person OV with me. I have not seen him in over 2 years

## 2018-11-18 NOTE — Telephone Encounter (Signed)
Pt called in and declined to make an appointment but will check with his company and if they are not able to give him the referral he will call back to make an appointment at that time.

## 2018-11-24 DIAGNOSIS — D2261 Melanocytic nevi of right upper limb, including shoulder: Secondary | ICD-10-CM | POA: Diagnosis not present

## 2018-11-24 DIAGNOSIS — L814 Other melanin hyperpigmentation: Secondary | ICD-10-CM | POA: Diagnosis not present

## 2018-11-24 DIAGNOSIS — D225 Melanocytic nevi of trunk: Secondary | ICD-10-CM | POA: Diagnosis not present

## 2018-11-24 DIAGNOSIS — D2262 Melanocytic nevi of left upper limb, including shoulder: Secondary | ICD-10-CM | POA: Diagnosis not present

## 2018-12-03 DIAGNOSIS — H6993 Unspecified Eustachian tube disorder, bilateral: Secondary | ICD-10-CM | POA: Insufficient documentation

## 2018-12-03 DIAGNOSIS — J342 Deviated nasal septum: Secondary | ICD-10-CM | POA: Diagnosis not present

## 2018-12-03 DIAGNOSIS — H9192 Unspecified hearing loss, left ear: Secondary | ICD-10-CM | POA: Diagnosis not present

## 2018-12-03 DIAGNOSIS — J343 Hypertrophy of nasal turbinates: Secondary | ICD-10-CM | POA: Insufficient documentation

## 2018-12-03 DIAGNOSIS — H6983 Other specified disorders of Eustachian tube, bilateral: Secondary | ICD-10-CM | POA: Diagnosis not present

## 2018-12-03 DIAGNOSIS — H9202 Otalgia, left ear: Secondary | ICD-10-CM | POA: Diagnosis not present

## 2019-01-20 DIAGNOSIS — H6983 Other specified disorders of Eustachian tube, bilateral: Secondary | ICD-10-CM | POA: Diagnosis not present

## 2019-01-20 DIAGNOSIS — H9202 Otalgia, left ear: Secondary | ICD-10-CM | POA: Diagnosis not present

## 2019-01-20 DIAGNOSIS — J342 Deviated nasal septum: Secondary | ICD-10-CM | POA: Diagnosis not present

## 2019-03-17 DIAGNOSIS — E291 Testicular hypofunction: Secondary | ICD-10-CM | POA: Diagnosis not present

## 2019-03-17 DIAGNOSIS — Z125 Encounter for screening for malignant neoplasm of prostate: Secondary | ICD-10-CM | POA: Diagnosis not present

## 2019-03-24 DIAGNOSIS — E291 Testicular hypofunction: Secondary | ICD-10-CM | POA: Diagnosis not present

## 2019-03-24 DIAGNOSIS — N5201 Erectile dysfunction due to arterial insufficiency: Secondary | ICD-10-CM | POA: Diagnosis not present

## 2019-04-06 DIAGNOSIS — U071 COVID-19: Secondary | ICD-10-CM | POA: Diagnosis not present

## 2019-06-25 ENCOUNTER — Telehealth: Payer: Self-pay | Admitting: Family Medicine

## 2019-06-25 NOTE — Telephone Encounter (Signed)
Pt would like to know if you will accept him back as a pt? Pt is aware that you are out of the office today and will return on 06/29/19. Thank you.

## 2019-06-30 NOTE — Telephone Encounter (Signed)
Yes I would be glad to see him again

## 2019-07-01 NOTE — Telephone Encounter (Signed)
Pt is scheduled for 08/05/19 at 9 am

## 2019-07-01 NOTE — Telephone Encounter (Signed)
Noted. Nothing further needed. 

## 2019-07-07 DIAGNOSIS — Z0189 Encounter for other specified special examinations: Secondary | ICD-10-CM | POA: Diagnosis not present

## 2019-08-04 ENCOUNTER — Other Ambulatory Visit: Payer: Self-pay

## 2019-08-05 ENCOUNTER — Encounter: Payer: Self-pay | Admitting: Family Medicine

## 2019-08-05 ENCOUNTER — Ambulatory Visit (INDEPENDENT_AMBULATORY_CARE_PROVIDER_SITE_OTHER): Payer: BC Managed Care – PPO | Admitting: Family Medicine

## 2019-08-05 VITALS — BP 124/62 | HR 76 | Temp 98.3°F | Ht 72.0 in | Wt 207.6 lb

## 2019-08-05 DIAGNOSIS — Z125 Encounter for screening for malignant neoplasm of prostate: Secondary | ICD-10-CM

## 2019-08-05 DIAGNOSIS — Z0000 Encounter for general adult medical examination without abnormal findings: Secondary | ICD-10-CM | POA: Diagnosis not present

## 2019-08-05 LAB — CBC WITH DIFFERENTIAL/PLATELET
Basophils Absolute: 0 10*3/uL (ref 0.0–0.1)
Basophils Relative: 0.5 % (ref 0.0–3.0)
Eosinophils Absolute: 0.1 10*3/uL (ref 0.0–0.7)
Eosinophils Relative: 2.5 % (ref 0.0–5.0)
HCT: 47.9 % (ref 39.0–52.0)
Hemoglobin: 16.5 g/dL (ref 13.0–17.0)
Lymphocytes Relative: 22.3 % (ref 12.0–46.0)
Lymphs Abs: 1.2 10*3/uL (ref 0.7–4.0)
MCHC: 34.4 g/dL (ref 30.0–36.0)
MCV: 92.5 fl (ref 78.0–100.0)
Monocytes Absolute: 0.6 10*3/uL (ref 0.1–1.0)
Monocytes Relative: 11.6 % (ref 3.0–12.0)
Neutro Abs: 3.3 10*3/uL (ref 1.4–7.7)
Neutrophils Relative %: 63.1 % (ref 43.0–77.0)
Platelets: 188 10*3/uL (ref 150.0–400.0)
RBC: 5.17 Mil/uL (ref 4.22–5.81)
RDW: 13.4 % (ref 11.5–15.5)
WBC: 5.2 10*3/uL (ref 4.0–10.5)

## 2019-08-05 LAB — HEPATIC FUNCTION PANEL
ALT: 38 U/L (ref 0–53)
AST: 36 U/L (ref 0–37)
Albumin: 4.5 g/dL (ref 3.5–5.2)
Alkaline Phosphatase: 43 U/L (ref 39–117)
Bilirubin, Direct: 0.2 mg/dL (ref 0.0–0.3)
Total Bilirubin: 1.1 mg/dL (ref 0.2–1.2)
Total Protein: 6.3 g/dL (ref 6.0–8.3)

## 2019-08-05 LAB — BASIC METABOLIC PANEL
BUN: 24 mg/dL — ABNORMAL HIGH (ref 6–23)
CO2: 28 mEq/L (ref 19–32)
Calcium: 9.5 mg/dL (ref 8.4–10.5)
Chloride: 99 mEq/L (ref 96–112)
Creatinine, Ser: 0.99 mg/dL (ref 0.40–1.50)
GFR: 80.03 mL/min (ref >60.00)
Glucose, Bld: 87 mg/dL (ref 70–99)
Potassium: 3.9 mEq/L (ref 3.5–5.1)
Sodium: 137 mEq/L (ref 135–145)

## 2019-08-05 LAB — TSH: TSH: 1.23 u[IU]/mL (ref 0.35–4.50)

## 2019-08-05 LAB — LIPID PANEL
Cholesterol: 184 mg/dL (ref 0–200)
HDL: 51.9 mg/dL (ref >39.00)
LDL Cholesterol: 113 mg/dL — ABNORMAL HIGH (ref 0–99)
NonHDL: 131.83
Total CHOL/HDL Ratio: 4
Triglycerides: 93 mg/dL (ref 0.0–149.0)
VLDL: 18.6 mg/dL (ref 0.0–40.0)

## 2019-08-05 LAB — PSA: PSA: 0.7 ng/mL (ref 0.10–4.00)

## 2019-08-05 NOTE — Progress Notes (Signed)
   Subjective:    Patient ID: Kevin Wilkinson, male    DOB: 1971/02/15, 49 y.o.   MRN: [id number]  HPI Here to re-establish and for a well exam. He feels great. He has been going to an urgent care clinic near his workplace for the past few years for heath issues.    Review of Systems  Constitutional: Negative.   HENT: Negative.   Eyes: Negative.   Respiratory: Negative.   Cardiovascular: Negative.   Gastrointestinal: Negative.   Genitourinary: Negative.   Musculoskeletal: Negative.   Skin: Negative.   Neurological: Negative.   Psychiatric/Behavioral: Negative.        Objective:   Physical Exam Constitutional:      General: He is not in acute distress.    Appearance: He is well-developed. He is not diaphoretic.  HENT:     Head: Normocephalic and atraumatic.     Right Ear: External ear normal.     Left Ear: External ear normal.     Nose: Nose normal.     Mouth/Throat:     Pharynx: No oropharyngeal exudate.  Eyes:     General: No scleral icterus.       Right eye: No discharge.        Left eye: No discharge.     Conjunctiva/sclera: Conjunctivae normal.     Pupils: Pupils are equal, round, and reactive to light.  Neck:     Thyroid: No thyromegaly.     Vascular: No JVD.     Trachea: No tracheal deviation.  Cardiovascular:     Rate and Rhythm: Normal rate and regular rhythm.     Heart sounds: Normal heart sounds. No murmur. No friction rub. No gallop.   Pulmonary:     Effort: Pulmonary effort is normal. No respiratory distress.     Breath sounds: Normal breath sounds. No wheezing or rales.  Chest:     Chest wall: No tenderness.  Abdominal:     General: Bowel sounds are normal. There is no distension.     Palpations: Abdomen is soft. There is no mass.     Tenderness: There is no abdominal tenderness. There is no guarding or rebound.  Genitourinary:    Penis: Normal. No tenderness.      Testes: Normal.     Prostate: Normal.     Rectum: Normal. Guaiac result negative.   Musculoskeletal:        General: No tenderness. Normal range of motion.     Cervical back: Neck supple.  Lymphadenopathy:     Cervical: No cervical adenopathy.  Skin:    General: Skin is warm and dry.     Coloration: Skin is not pale.     Findings: No erythema or rash.  Neurological:     Mental Status: He is alert and oriented to person, place, and time.     Cranial Nerves: No cranial nerve deficit.     Motor: No abnormal muscle tone.     Coordination: Coordination normal.     Deep Tendon Reflexes: Reflexes are normal and symmetric. Reflexes normal.  Psychiatric:        Behavior: Behavior normal.        Thought Content: Thought content normal.        Judgment: Judgment normal.           Assessment & Plan:  Well exam. We discussed diet and exercise. Get fasting labs. Set up his first colonoscopy.  Alysia Penna, MD

## 2019-08-06 ENCOUNTER — Encounter: Payer: Self-pay | Admitting: Internal Medicine

## 2019-08-09 DIAGNOSIS — Z20828 Contact with and (suspected) exposure to other viral communicable diseases: Secondary | ICD-10-CM | POA: Diagnosis not present

## 2019-08-09 DIAGNOSIS — Z20822 Contact with and (suspected) exposure to covid-19: Secondary | ICD-10-CM | POA: Diagnosis not present

## 2019-10-05 ENCOUNTER — Encounter: Payer: BC Managed Care – PPO | Admitting: Internal Medicine

## 2019-10-26 ENCOUNTER — Ambulatory Visit (AMBULATORY_SURGERY_CENTER): Payer: Self-pay | Admitting: *Deleted

## 2019-10-26 ENCOUNTER — Other Ambulatory Visit: Payer: Self-pay

## 2019-10-26 ENCOUNTER — Encounter: Payer: Self-pay | Admitting: Internal Medicine

## 2019-10-26 VITALS — Ht 72.0 in | Wt 211.8 lb

## 2019-10-26 DIAGNOSIS — Z01818 Encounter for other preprocedural examination: Secondary | ICD-10-CM

## 2019-10-26 DIAGNOSIS — Z1211 Encounter for screening for malignant neoplasm of colon: Secondary | ICD-10-CM

## 2019-10-26 MED ORDER — SUTAB 1479-225-188 MG PO TABS: 1.0000 | ORAL_TABLET | Freq: Once | ORAL | 0 refills | Status: AC

## 2019-10-26 NOTE — Progress Notes (Signed)
covid test 11-06-19 at 800  Pt is aware that care partner will wait in the car during procedure; if they feel like they will be too hot or cold to wait in the car; they may wait in the 4 th floor lobby. Patient is aware to bring only one care partner. We want them to wear a mask (we do not have any that we can provide them), practice social distancing, and we will check their temperatures when they get here.  I did remind the patient that their care partner needs to stay in the parking lot the entire time and have a cell phone available, we will call them when the pt is ready for discharge. Patient will wear mask into building.   No trouble with anesthesia, difficulty with intubation or hx/fam hx of malignant hyperthermia per pt   No egg or soy allergy  No home oxygen use   No medications for weight loss taken  emmi information given- via MyChart  Pt denies constipation issues   Sutab code put into RX and paper copy given to pt to show pharmacy

## 2019-11-06 ENCOUNTER — Other Ambulatory Visit: Payer: Self-pay | Admitting: Internal Medicine

## 2019-11-06 ENCOUNTER — Ambulatory Visit (INDEPENDENT_AMBULATORY_CARE_PROVIDER_SITE_OTHER): Payer: BC Managed Care – PPO

## 2019-11-06 DIAGNOSIS — Z1159 Encounter for screening for other viral diseases: Secondary | ICD-10-CM

## 2019-11-06 LAB — SARS CORONAVIRUS 2 (TAT 6-24 HRS): SARS Coronavirus 2: NEGATIVE

## 2019-11-10 ENCOUNTER — Ambulatory Visit (AMBULATORY_SURGERY_CENTER): Payer: BC Managed Care – PPO | Admitting: Internal Medicine

## 2019-11-10 ENCOUNTER — Encounter: Payer: Self-pay | Admitting: Internal Medicine

## 2019-11-10 ENCOUNTER — Other Ambulatory Visit: Payer: Self-pay

## 2019-11-10 VITALS — BP 131/96 | HR 63 | Temp 97.3°F | Resp 15 | Ht 72.0 in | Wt 211.0 lb

## 2019-11-10 DIAGNOSIS — Z1211 Encounter for screening for malignant neoplasm of colon: Secondary | ICD-10-CM | POA: Diagnosis not present

## 2019-11-10 HISTORY — PX: COLONOSCOPY: SHX174

## 2019-11-10 MED ORDER — SODIUM CHLORIDE 0.9 % IV SOLN: 500.0000 mL | Freq: Once | INTRAVENOUS | Status: DC

## 2019-11-10 NOTE — Progress Notes (Signed)
A and O x3. Report to RN. Tolerated MAC anesthesia well.

## 2019-11-10 NOTE — Patient Instructions (Signed)
YOU HAD AN ENDOSCOPIC PROCEDURE TODAY AT THE [facility]:   Refer to the procedure report that was given to you for any specific questions about what was found during the examination.  If the procedure report does not answer your questions, please call your gastroenterologist to clarify.  If you requested that your care partner not be given the details of your procedure findings, then the procedure report has been included in a sealed envelope for you to review at your convenience later.  YOU SHOULD EXPECT: Some feelings of bloating in the abdomen. Passage of more gas than usual.  Walking can help get rid of the air that was put into your GI tract during the procedure and reduce the bloating. If you had a lower endoscopy (such as a colonoscopy or flexible sigmoidoscopy) you may notice spotting of blood in your stool or on the toilet paper. If you underwent a bowel prep for your procedure, you may not have a normal bowel movement for a few days.  Please Note:  You might notice some irritation and congestion in your nose or some drainage.  This is from the oxygen used during your procedure.  There is no need for concern and it should clear up in a day or so.  SYMPTOMS TO REPORT IMMEDIATELY:   Following lower endoscopy (colonoscopy or flexible sigmoidoscopy):  Excessive amounts of blood in the stool  Significant tenderness or worsening of abdominal pains  Swelling of the abdomen that is new, acute  Fever of 100F or higher  For urgent or emergent issues, a gastroenterologist can be reached at any hour by calling [phone]. Do not use MyChart messaging for urgent concerns.    DIET:  We do recommend a small meal at first, but then you may proceed to your regular diet.  Drink plenty of fluids but you should avoid alcoholic beverages for 24 hours.  ACTIVITY:  You should plan to take it easy for the rest of today and you should NOT DRIVE or use heavy machinery until tomorrow (because  of the sedation medicines used during the test).    FOLLOW UP: Our staff will call the number listed on your records 48-72 hours following your procedure to check on you and address any questions or concerns that you may have regarding the information given to you following your procedure. If we do not reach you, we will leave a message.  We will attempt to reach you two times.  During this call, we will ask if you have developed any symptoms of COVID 19. If you develop any symptoms (ie: fever, flu-like symptoms, shortness of breath, cough etc.) before then, please call [phone].  If you test positive for Covid 19 in the 2 weeks post procedure, please call and report this information to us.    If any biopsies were taken you will be contacted by phone or by letter within the next 1-3 weeks.  Please call us at [phone] if you have not heard about the biopsies in 3 weeks.    SIGNATURES/CONFIDENTIALITY: You and/or your care partner have signed paperwork which will be entered into your electronic medical record.  These signatures attest to the fact that that the information above on your After Visit Summary has been reviewed and is understood.  Full responsibility of the confidentiality of this discharge information lies with you and/or your care-partner. 

## 2019-11-10 NOTE — Op Note (Signed)
[name] Procedure Date: 11/10/2019 8:33 AM MRN: [id number] Endoscopist: Jerene Bears , MD Age: 49 Referring MD:  Date of Birth: [birth year] Gender: Male Account #: [id number] Procedure:                Colonoscopy Indications:              Screening for colorectal malignant neoplasm, This                            is the patient's first colonoscopy Medicines:                Monitored Anesthesia Care Procedure:                Pre-Anesthesia Assessment:                           - Prior to the procedure, a History and Physical                            was performed, and patient medications and                            allergies were reviewed. The patient's tolerance of                            previous anesthesia was also reviewed. The risks                            and benefits of the procedure and the sedation                            options and risks were discussed with the patient.                            All questions were answered, and informed consent                            was obtained. Prior Anticoagulants: The patient has                            taken no previous anticoagulant or antiplatelet                            agents. ASA Grade Assessment: II - A patient with                            mild systemic disease. After reviewing the risks                            and benefits, the patient was deemed in                            satisfactory condition to undergo the procedure.  After obtaining informed consent, the colonoscope                            was passed under direct vision. Throughout the                            procedure, the patient's blood pressure, pulse, and                            oxygen saturations were monitored continuously. The                            Colonoscope was introduced through the anus and                            advanced to the cecum,  identified by appendiceal                            orifice and ileocecal valve. The colonoscopy was                            performed without difficulty. The patient tolerated                            the procedure well. The quality of the bowel                            preparation was good. The ileocecal valve,                            appendiceal orifice, and rectum were photographed. Scope In: 8:50:29 AM Scope Out: 9:08:37 AM Scope Withdrawal Time: 0 hours 14 minutes 31 seconds  Total Procedure Duration: 0 hours 18 minutes 8 seconds  Findings:                 The digital rectal exam was normal.                           A few small-mouthed diverticula were found in the                            sigmoid colon.                           The exam was otherwise without abnormality on                            direct and retroflexion views. Complications:            No immediate complications. Estimated Blood Loss:     Estimated blood loss: none. Impression:               - Mild diverticulosis in the sigmoid colon.                           - The examination was  otherwise normal on direct                            and retroflexion views.                           - No specimens collected. Recommendation:           - Patient has a contact number available for                            emergencies. The signs and symptoms of potential                            delayed complications were discussed with the                            patient. Return to normal activities tomorrow.                            Written discharge instructions were provided to the                            patient.                           - Resume previous diet.                           - Continue present medications.                           - Repeat colonoscopy in 10 years for screening                            purposes. Jerene Bears, MD 11/10/2019 9:13:00 AM This report has been signed  electronically.

## 2019-11-10 NOTE — Progress Notes (Signed)
Pt's states no medical or surgical changes since previsit or office visit. 

## 2019-11-12 ENCOUNTER — Telehealth: Payer: Self-pay

## 2019-11-12 NOTE — Telephone Encounter (Signed)
  Follow up Call-  Call back number 11/10/2019  Post procedure Call Back phone  # [phone]  Permission to leave phone message Yes  Some recent data might be hidden     Patient questions:  Do you have a fever, pain , or abdominal swelling? No. Pain Score  0 *  Have you tolerated food without any problems? Yes.    Have you been able to return to your normal activities? Yes.    Do you have any questions about your discharge instructions: Diet   No. Medications  No. Follow up visit  No.  Do you have questions or concerns about your Care? No.  Actions: * If pain score is 4 or above: No action needed, pain <4.   1. Have you developed a fever since your procedure? No   2.   Have you had an respiratory symptoms (SOB or cough) since your procedure? No   3.   Have you tested positive for COVID 19 since your procedure? No   4.   Have you had any family members/close contacts diagnosed with the COVID 19 since your procedure?  No    If yes to any of these questions please route to Joylene John, RN and Joella Prince, RN

## 2019-12-03 DIAGNOSIS — Z0189 Encounter for other specified special examinations: Secondary | ICD-10-CM | POA: Diagnosis not present

## 2019-12-11 DIAGNOSIS — D235 Other benign neoplasm of skin of trunk: Secondary | ICD-10-CM | POA: Diagnosis not present

## 2019-12-11 DIAGNOSIS — D2262 Melanocytic nevi of left upper limb, including shoulder: Secondary | ICD-10-CM | POA: Diagnosis not present

## 2019-12-11 DIAGNOSIS — D2261 Melanocytic nevi of right upper limb, including shoulder: Secondary | ICD-10-CM | POA: Diagnosis not present

## 2019-12-11 DIAGNOSIS — D225 Melanocytic nevi of trunk: Secondary | ICD-10-CM | POA: Diagnosis not present

## 2020-04-05 DIAGNOSIS — E291 Testicular hypofunction: Secondary | ICD-10-CM | POA: Diagnosis not present

## 2020-04-08 DIAGNOSIS — Z0189 Encounter for other specified special examinations: Secondary | ICD-10-CM | POA: Diagnosis not present

## 2020-04-15 DIAGNOSIS — E291 Testicular hypofunction: Secondary | ICD-10-CM | POA: Diagnosis not present

## 2020-04-15 DIAGNOSIS — N5201 Erectile dysfunction due to arterial insufficiency: Secondary | ICD-10-CM | POA: Diagnosis not present

## 2020-05-10 DIAGNOSIS — Z20822 Contact with and (suspected) exposure to covid-19: Secondary | ICD-10-CM | POA: Diagnosis not present

## 2020-05-24 DIAGNOSIS — Z20822 Contact with and (suspected) exposure to covid-19: Secondary | ICD-10-CM | POA: Diagnosis not present

## 2020-06-22 DIAGNOSIS — H9193 Unspecified hearing loss, bilateral: Secondary | ICD-10-CM | POA: Insufficient documentation

## 2020-06-22 DIAGNOSIS — H9212 Otorrhea, left ear: Secondary | ICD-10-CM | POA: Diagnosis not present

## 2020-06-22 DIAGNOSIS — J343 Hypertrophy of nasal turbinates: Secondary | ICD-10-CM | POA: Diagnosis not present

## 2020-06-22 DIAGNOSIS — J310 Chronic rhinitis: Secondary | ICD-10-CM | POA: Diagnosis not present

## 2020-06-22 DIAGNOSIS — J342 Deviated nasal septum: Secondary | ICD-10-CM | POA: Diagnosis not present

## 2020-08-03 DIAGNOSIS — J343 Hypertrophy of nasal turbinates: Secondary | ICD-10-CM | POA: Diagnosis not present

## 2020-08-03 DIAGNOSIS — H6983 Other specified disorders of Eustachian tube, bilateral: Secondary | ICD-10-CM | POA: Diagnosis not present

## 2020-08-03 DIAGNOSIS — J342 Deviated nasal septum: Secondary | ICD-10-CM | POA: Diagnosis not present

## 2020-08-03 DIAGNOSIS — H9192 Unspecified hearing loss, left ear: Secondary | ICD-10-CM | POA: Diagnosis not present

## 2020-08-03 DIAGNOSIS — H9012 Conductive hearing loss, unilateral, left ear, with unrestricted hearing on the contralateral side: Secondary | ICD-10-CM | POA: Diagnosis not present

## 2020-08-08 ENCOUNTER — Other Ambulatory Visit: Payer: Self-pay

## 2020-08-08 ENCOUNTER — Ambulatory Visit (INDEPENDENT_AMBULATORY_CARE_PROVIDER_SITE_OTHER): Payer: BC Managed Care – PPO | Admitting: Family Medicine

## 2020-08-08 ENCOUNTER — Encounter: Payer: Self-pay | Admitting: Family Medicine

## 2020-08-08 VITALS — BP 130/88 | HR 69 | Temp 97.5°F | Ht 70.75 in | Wt 212.2 lb

## 2020-08-08 DIAGNOSIS — Z0000 Encounter for general adult medical examination without abnormal findings: Secondary | ICD-10-CM | POA: Diagnosis not present

## 2020-08-08 DIAGNOSIS — M1000 Idiopathic gout, unspecified site: Secondary | ICD-10-CM

## 2020-08-08 DIAGNOSIS — Z125 Encounter for screening for malignant neoplasm of prostate: Secondary | ICD-10-CM

## 2020-08-08 LAB — HEPATIC FUNCTION PANEL
ALT: 33 U/L (ref 0–53)
AST: 32 U/L (ref 0–37)
Albumin: 4.8 g/dL (ref 3.5–5.2)
Alkaline Phosphatase: 49 U/L (ref 39–117)
Bilirubin, Direct: 0.2 mg/dL (ref 0.0–0.3)
Total Bilirubin: 1.4 mg/dL — ABNORMAL HIGH (ref 0.2–1.2)
Total Protein: 6.8 g/dL (ref 6.0–8.3)

## 2020-08-08 LAB — CBC WITH DIFFERENTIAL/PLATELET
Basophils Absolute: 0 10*3/uL (ref 0.0–0.1)
Basophils Relative: 0.8 % (ref 0.0–3.0)
Eosinophils Absolute: 0.2 10*3/uL (ref 0.0–0.7)
Eosinophils Relative: 4.3 % (ref 0.0–5.0)
HCT: 48.5 % (ref 39.0–52.0)
Hemoglobin: 17 g/dL (ref 13.0–17.0)
Lymphocytes Relative: 30.1 % (ref 12.0–46.0)
Lymphs Abs: 1.5 10*3/uL (ref 0.7–4.0)
MCHC: 35 g/dL (ref 30.0–36.0)
MCV: 91.4 fl (ref 78.0–100.0)
Monocytes Absolute: 0.6 10*3/uL (ref 0.1–1.0)
Monocytes Relative: 12.2 % — ABNORMAL HIGH (ref 3.0–12.0)
Neutro Abs: 2.7 10*3/uL (ref 1.4–7.7)
Neutrophils Relative %: 52.6 % (ref 43.0–77.0)
Platelets: 180 10*3/uL (ref 150.0–400.0)
RBC: 5.3 Mil/uL (ref 4.22–5.81)
RDW: 13 % (ref 11.5–15.5)
WBC: 5.1 10*3/uL (ref 4.0–10.5)

## 2020-08-08 LAB — BASIC METABOLIC PANEL
BUN: 19 mg/dL (ref 6–23)
CO2: 28 mEq/L (ref 19–32)
Calcium: 9.8 mg/dL (ref 8.4–10.5)
Chloride: 98 mEq/L (ref 96–112)
Creatinine, Ser: 1.12 mg/dL (ref 0.40–1.50)
GFR: 76.34 mL/min (ref >60.00)
Glucose, Bld: 86 mg/dL (ref 70–99)
Potassium: 4.7 mEq/L (ref 3.5–5.1)
Sodium: 137 mEq/L (ref 135–145)

## 2020-08-08 LAB — TSH: TSH: 1.45 u[IU]/mL (ref 0.35–4.50)

## 2020-08-08 LAB — LIPID PANEL
Cholesterol: 201 mg/dL — ABNORMAL HIGH (ref 0–200)
HDL: 47.2 mg/dL (ref >39.00)
NonHDL: 153.66
Total CHOL/HDL Ratio: 4
Triglycerides: 284 mg/dL — ABNORMAL HIGH (ref 0.0–149.0)
VLDL: 56.8 mg/dL — ABNORMAL HIGH (ref 0.0–40.0)

## 2020-08-08 LAB — PSA: PSA: 0.88 ng/mL (ref 0.10–4.00)

## 2020-08-08 LAB — T3, FREE: T3, Free: 4.1 pg/mL (ref 2.3–4.2)

## 2020-08-08 LAB — URIC ACID: Uric Acid, Serum: 5.9 mg/dL (ref 4.0–7.8)

## 2020-08-08 LAB — HEMOGLOBIN A1C: Hgb A1c MFr Bld: 5.5 % (ref 4.6–6.5)

## 2020-08-08 LAB — T4, FREE: Free T4: 0.84 ng/dL (ref 0.60–1.60)

## 2020-08-08 LAB — LDL CHOLESTEROL, DIRECT: Direct LDL: 112 mg/dL

## 2020-08-08 NOTE — Progress Notes (Signed)
   Subjective:    Patient ID: Kevin Wilkinson, male    DOB: January 06, 1971, 50 y.o.   MRN: [id number]  HPI Here for a well exam. He feels fine. He sees Urology once a year for follow up and a testosterone level.    Review of Systems  Constitutional: Negative.   HENT: Negative.   Eyes: Negative.   Respiratory: Negative.   Cardiovascular: Negative.   Gastrointestinal: Negative.   Genitourinary: Negative.   Musculoskeletal: Negative.   Skin: Negative.   Neurological: Negative.   Psychiatric/Behavioral: Negative.        Objective:   Physical Exam Constitutional:      General: He is not in acute distress.    Appearance: Normal appearance. He is well-developed. He is not diaphoretic.  HENT:     Head: Normocephalic and atraumatic.     Right Ear: External ear normal.     Left Ear: External ear normal.     Nose: Nose normal.     Mouth/Throat:     Pharynx: No oropharyngeal exudate.  Eyes:     General: No scleral icterus.       Right eye: No discharge.        Left eye: No discharge.     Conjunctiva/sclera: Conjunctivae normal.     Pupils: Pupils are equal, round, and reactive to light.  Neck:     Thyroid: No thyromegaly.     Vascular: No JVD.     Trachea: No tracheal deviation.  Cardiovascular:     Rate and Rhythm: Normal rate and regular rhythm.     Heart sounds: Normal heart sounds. No murmur heard. No friction rub. No gallop.   Pulmonary:     Effort: Pulmonary effort is normal. No respiratory distress.     Breath sounds: Normal breath sounds. No wheezing or rales.  Chest:     Chest wall: No tenderness.  Abdominal:     General: Bowel sounds are normal. There is no distension.     Palpations: Abdomen is soft. There is no mass.     Tenderness: There is no abdominal tenderness. There is no guarding or rebound.  Genitourinary:    Penis: Normal. No tenderness.      Testes: Normal.     Prostate: Normal.     Rectum: Normal. Guaiac result negative.  Musculoskeletal:         General: No tenderness. Normal range of motion.     Cervical back: Neck supple.  Lymphadenopathy:     Cervical: No cervical adenopathy.  Skin:    General: Skin is warm and dry.     Coloration: Skin is not pale.     Findings: No erythema or rash.  Neurological:     Mental Status: He is alert and oriented to person, place, and time.     Cranial Nerves: No cranial nerve deficit.     Motor: No abnormal muscle tone.     Coordination: Coordination normal.     Deep Tendon Reflexes: Reflexes are normal and symmetric. Reflexes normal.  Psychiatric:        Behavior: Behavior normal.        Thought Content: Thought content normal.        Judgment: Judgment normal.           Assessment & Plan:  Well exam. We discussed diet and exercise. Get fasting labs.  Alysia Penna, MD

## 2020-11-04 DIAGNOSIS — J069 Acute upper respiratory infection, unspecified: Secondary | ICD-10-CM | POA: Diagnosis not present

## 2020-11-14 DIAGNOSIS — Z0189 Encounter for other specified special examinations: Secondary | ICD-10-CM | POA: Diagnosis not present

## 2020-12-16 DIAGNOSIS — D225 Melanocytic nevi of trunk: Secondary | ICD-10-CM | POA: Diagnosis not present

## 2020-12-16 DIAGNOSIS — D2262 Melanocytic nevi of left upper limb, including shoulder: Secondary | ICD-10-CM | POA: Diagnosis not present

## 2020-12-16 DIAGNOSIS — D2261 Melanocytic nevi of right upper limb, including shoulder: Secondary | ICD-10-CM | POA: Diagnosis not present

## 2020-12-16 DIAGNOSIS — L814 Other melanin hyperpigmentation: Secondary | ICD-10-CM | POA: Diagnosis not present

## 2020-12-22 ENCOUNTER — Encounter: Payer: Self-pay | Admitting: Family Medicine

## 2020-12-22 NOTE — Telephone Encounter (Signed)
Last refill 2018.

## 2020-12-23 MED ORDER — DICLOFENAC SODIUM 75 MG PO TBEC: 75.0000 mg | DELAYED_RELEASE_TABLET | Freq: Two times a day (BID) | ORAL | 3 refills | Status: DC

## 2020-12-23 NOTE — Telephone Encounter (Signed)
Done

## 2021-04-19 DIAGNOSIS — E291 Testicular hypofunction: Secondary | ICD-10-CM | POA: Diagnosis not present

## 2021-04-19 DIAGNOSIS — Z125 Encounter for screening for malignant neoplasm of prostate: Secondary | ICD-10-CM | POA: Diagnosis not present

## 2021-04-26 DIAGNOSIS — N5201 Erectile dysfunction due to arterial insufficiency: Secondary | ICD-10-CM | POA: Diagnosis not present

## 2021-04-26 DIAGNOSIS — E291 Testicular hypofunction: Secondary | ICD-10-CM | POA: Diagnosis not present

## 2021-06-27 DIAGNOSIS — Z7189 Other specified counseling: Secondary | ICD-10-CM | POA: Diagnosis not present

## 2021-07-31 DIAGNOSIS — Z0189 Encounter for other specified special examinations: Secondary | ICD-10-CM | POA: Diagnosis not present

## 2021-08-07 DIAGNOSIS — Z043 Encounter for examination and observation following other accident: Secondary | ICD-10-CM | POA: Diagnosis not present

## 2021-08-07 DIAGNOSIS — I10 Essential (primary) hypertension: Secondary | ICD-10-CM | POA: Diagnosis not present

## 2021-08-07 DIAGNOSIS — Z713 Dietary counseling and surveillance: Secondary | ICD-10-CM | POA: Diagnosis not present

## 2021-08-09 ENCOUNTER — Encounter: Payer: Self-pay | Admitting: Family Medicine

## 2021-08-09 ENCOUNTER — Ambulatory Visit (INDEPENDENT_AMBULATORY_CARE_PROVIDER_SITE_OTHER): Payer: BC Managed Care – PPO | Admitting: Family Medicine

## 2021-08-09 VITALS — BP 120/80 | HR 61 | Temp 97.7°F | Ht 71.0 in | Wt 211.0 lb

## 2021-08-09 DIAGNOSIS — Z0000 Encounter for general adult medical examination without abnormal findings: Secondary | ICD-10-CM

## 2021-08-09 MED ORDER — DICLOFENAC SODIUM 75 MG PO TBEC: 75.0000 mg | DELAYED_RELEASE_TABLET | Freq: Two times a day (BID) | ORAL | 3 refills | Status: DC

## 2021-08-09 MED ORDER — ATORVASTATIN CALCIUM 40 MG PO TABS: 40.0000 mg | ORAL_TABLET | Freq: Every day | ORAL | 3 refills | Status: DC

## 2021-08-09 MED ORDER — ALLOPURINOL 300 MG PO TABS: 300.0000 mg | ORAL_TABLET | Freq: Every day | ORAL | 3 refills | Status: DC

## 2021-08-09 MED ORDER — ATORVASTATIN CALCIUM 20 MG PO TABS: 20.0000 mg | ORAL_TABLET | Freq: Every day | ORAL | 3 refills | Status: DC

## 2021-08-09 MED ORDER — LISINOPRIL-HYDROCHLOROTHIAZIDE 20-25 MG PO TABS: 1.0000 | ORAL_TABLET | Freq: Every day | ORAL | 3 refills | Status: DC

## 2021-08-09 NOTE — Progress Notes (Signed)
? ?Subjective:  ? ? Patient ID: Kevin Wilkinson, male    DOB: [birth year], 51 y.o.   MRN: [id number] ? ?HPI ?Here for a well exam. He feels great and has no concerns. He had fasting labs done at his workplace on 07-31-20 and these were all normal except the LDL was high at 106 and the TG were high at 292. He still sees Urology and they supply his Cialis, but he no longer takes Clomiphene. Apparently this is no longer available.  ? ? ?Review of Systems  ?Constitutional: Negative.   ?HENT: Negative.    ?Eyes: Negative.   ?Respiratory: Negative.    ?Cardiovascular: Negative.   ?Gastrointestinal: Negative.   ?Genitourinary: Negative.   ?Musculoskeletal: Negative.   ?Skin: Negative.   ?Neurological: Negative.   ?Psychiatric/Behavioral: Negative.    ? ?   ?Objective:  ? Physical Exam ?Constitutional:   ?   General: He is not in acute distress. ?   Appearance: Normal appearance. He is well-developed. He is not diaphoretic.  ?HENT:  ?   Head: Normocephalic and atraumatic.  ?   Right Ear: External ear normal.  ?   Left Ear: External ear normal.  ?   Nose: Nose normal.  ?   Mouth/Throat:  ?   Pharynx: No oropharyngeal exudate.  ?Eyes:  ?   General: No scleral icterus.    ?   Right eye: No discharge.     ?   Left eye: No discharge.  ?   Conjunctiva/sclera: Conjunctivae normal.  ?   Pupils: Pupils are equal, round, and reactive to light.  ?Neck:  ?   Thyroid: No thyromegaly.  ?   Vascular: No JVD.  ?   Trachea: No tracheal deviation.  ?Cardiovascular:  ?   Rate and Rhythm: Normal rate and regular rhythm.  ?   Heart sounds: Normal heart sounds. No murmur heard. ?  No friction rub. No gallop.  ?Pulmonary:  ?   Effort: Pulmonary effort is normal. No respiratory distress.  ?   Breath sounds: Normal breath sounds. No wheezing or rales.  ?Chest:  ?   Chest wall: No tenderness.  ?Abdominal:  ?   General: Bowel sounds are normal. There is no distension.  ?   Palpations: Abdomen is soft. There is no mass.  ?   Tenderness: There is no  abdominal tenderness. There is no guarding or rebound.  ?Genitourinary: ?   Penis: No tenderness.   ?Musculoskeletal:     ?   General: No tenderness. Normal range of motion.  ?   Cervical back: Neck supple.  ?Lymphadenopathy:  ?   Cervical: No cervical adenopathy.  ?Skin: ?   General: Skin is warm and dry.  ?   Coloration: Skin is not pale.  ?   Findings: No erythema or rash.  ?Neurological:  ?   Mental Status: He is alert and oriented to person, place, and time.  ?   Cranial Nerves: No cranial nerve deficit.  ?   Motor: No abnormal muscle tone.  ?   Coordination: Coordination normal.  ?   Deep Tendon Reflexes: Reflexes are normal and symmetric. Reflexes normal.  ?Psychiatric:     ?   Behavior: Behavior normal.     ?   Thought Content: Thought content normal.     ?   Judgment: Judgment normal.  ? ? ? ? ? ?   ?Assessment & Plan:  ?Well exam. We discussed diet and exercise. We will increase the  Lipitor to 40 mg daily.  ?Alysia Penna, MD ? ? ?

## 2021-08-14 DIAGNOSIS — H6983 Other specified disorders of Eustachian tube, bilateral: Secondary | ICD-10-CM | POA: Diagnosis not present

## 2021-08-14 DIAGNOSIS — J342 Deviated nasal septum: Secondary | ICD-10-CM | POA: Diagnosis not present

## 2021-08-14 DIAGNOSIS — J343 Hypertrophy of nasal turbinates: Secondary | ICD-10-CM | POA: Diagnosis not present

## 2021-09-07 DIAGNOSIS — J343 Hypertrophy of nasal turbinates: Secondary | ICD-10-CM | POA: Diagnosis not present

## 2021-09-07 DIAGNOSIS — J342 Deviated nasal septum: Secondary | ICD-10-CM | POA: Diagnosis not present

## 2021-10-19 DIAGNOSIS — H25813 Combined forms of age-related cataract, bilateral: Secondary | ICD-10-CM | POA: Diagnosis not present

## 2021-10-19 DIAGNOSIS — H524 Presbyopia: Secondary | ICD-10-CM | POA: Diagnosis not present

## 2021-10-19 DIAGNOSIS — H5213 Myopia, bilateral: Secondary | ICD-10-CM | POA: Diagnosis not present

## 2021-10-25 DIAGNOSIS — E291 Testicular hypofunction: Secondary | ICD-10-CM | POA: Diagnosis not present

## 2021-11-01 DIAGNOSIS — H2513 Age-related nuclear cataract, bilateral: Secondary | ICD-10-CM | POA: Diagnosis not present

## 2021-11-01 DIAGNOSIS — E291 Testicular hypofunction: Secondary | ICD-10-CM | POA: Diagnosis not present

## 2021-11-01 DIAGNOSIS — H25013 Cortical age-related cataract, bilateral: Secondary | ICD-10-CM | POA: Diagnosis not present

## 2021-11-01 DIAGNOSIS — N5201 Erectile dysfunction due to arterial insufficiency: Secondary | ICD-10-CM | POA: Diagnosis not present

## 2021-11-08 DIAGNOSIS — H2511 Age-related nuclear cataract, right eye: Secondary | ICD-10-CM | POA: Diagnosis not present

## 2021-11-08 DIAGNOSIS — H25811 Combined forms of age-related cataract, right eye: Secondary | ICD-10-CM | POA: Diagnosis not present

## 2021-12-26 DIAGNOSIS — R059 Cough, unspecified: Secondary | ICD-10-CM | POA: Diagnosis not present

## 2021-12-27 DIAGNOSIS — D2371 Other benign neoplasm of skin of right lower limb, including hip: Secondary | ICD-10-CM | POA: Diagnosis not present

## 2021-12-27 DIAGNOSIS — D225 Melanocytic nevi of trunk: Secondary | ICD-10-CM | POA: Diagnosis not present

## 2021-12-27 DIAGNOSIS — D2372 Other benign neoplasm of skin of left lower limb, including hip: Secondary | ICD-10-CM | POA: Diagnosis not present

## 2021-12-27 DIAGNOSIS — L814 Other melanin hyperpigmentation: Secondary | ICD-10-CM | POA: Diagnosis not present

## 2022-03-13 DIAGNOSIS — Z961 Presence of intraocular lens: Secondary | ICD-10-CM | POA: Diagnosis not present

## 2022-05-25 ENCOUNTER — Encounter: Payer: Self-pay | Admitting: Family Medicine

## 2022-05-25 ENCOUNTER — Ambulatory Visit: Payer: BC Managed Care – PPO | Admitting: Family Medicine

## 2022-05-25 ENCOUNTER — Telehealth: Payer: Self-pay | Admitting: Family Medicine

## 2022-05-25 VITALS — BP 136/80 | HR 82 | Temp 98.6°F | Resp 12 | Ht 71.0 in | Wt 220.0 lb

## 2022-05-25 DIAGNOSIS — R051 Acute cough: Secondary | ICD-10-CM

## 2022-05-25 MED ORDER — HYDROCODONE BIT-HOMATROP MBR 5-1.5 MG/5ML PO SOLN: 5.0000 mL | Freq: Every evening | ORAL | 0 refills | Status: AC | PRN

## 2022-05-25 NOTE — Telephone Encounter (Signed)
FYI:    [facility] on [address] called to say they are out of Hycodan for this Pt.   They literally just ran out.  Please advise.

## 2022-05-25 NOTE — Progress Notes (Signed)
ACUTE VISIT Chief Complaint  Patient presents with   Cough    X 4 days, keeping up at night.    HPI: Kevin Wilkinson is a 52 y.o. male with past medical history significant for hypertension and gout here today complaining of persistent cough for the past four days. Cough This is a new problem. The current episode started in the past 7 days. The problem occurs every few hours. The cough is Non-productive. Associated symptoms include postnasal drip and rhinorrhea. Pertinent negatives include no chest pain, chills, ear congestion, ear pain, fever, headaches, heartburn, hemoptysis, myalgias, rash, shortness of breath, sweats or wheezing. The symptoms are aggravated by lying down. The treatment provided no relief. There is no history of asthma or environmental allergies.  He reports experiencing mild nasal congestion and a sore throat when he coughs. Cough is worse at night, interfering with his sleep.  He states that for the past couple days he and his wife have not been able to sleep due to his cough, he would like to have a cough suppressant.  He is unaware of any recent exposure to sick individuals.  He has tried over-the-counter cough medication and was prescribed Benzonatate by a PA at work, but has not found relief with either treatment.  He has no history of allergies and does not smoke. Hypertension on lisinopril-HCTZ 20-25 mg daily, he has been on this medication for a while.  Review of Systems  Constitutional:  Negative for chills and fever.  HENT:  Positive for postnasal drip and rhinorrhea. Negative for congestion, ear pain, mouth sores and trouble swallowing.   Respiratory:  Positive for cough. Negative for hemoptysis, shortness of breath and wheezing.   Cardiovascular:  Negative for chest pain.  Gastrointestinal:  Negative for abdominal pain, heartburn, nausea and vomiting.  Musculoskeletal:  Negative for myalgias.  Skin:  Negative for rash.  Allergic/Immunologic: Negative  for environmental allergies.  Neurological:  Negative for syncope, weakness and headaches.  Hematological:  Negative for adenopathy. Does not bruise/bleed easily.  Psychiatric/Behavioral:  Positive for sleep disturbance.   See other pertinent positives and negatives in HPI.  Current Outpatient Medications on File Prior to Visit  Medication Sig Dispense Refill   allopurinol (ZYLOPRIM) 300 MG tablet Take 1 tablet (300 mg total) by mouth daily. 90 tablet 3   atorvastatin (LIPITOR) 40 MG tablet Take 1 tablet (40 mg total) by mouth daily. 90 tablet 3   diclofenac (VOLTAREN) 75 MG EC tablet Take 1 tablet (75 mg total) by mouth 2 (two) times daily. 180 tablet 3   ibuprofen (ADVIL,MOTRIN) 200 MG tablet Take 400 mg by mouth every 6 (six) hours as needed (pain).     lisinopril-hydrochlorothiazide (ZESTORETIC) 20-25 MG tablet Take 1 tablet by mouth daily. 90 tablet 3   Omega-3 Fatty Acids (FISH OIL PO) Take 1 tablet by mouth daily.     tadalafil (CIALIS) 5 MG tablet Take 5 mg by mouth daily as needed for erectile dysfunction.     No current facility-administered medications on file prior to visit.   Past Medical History:  Diagnosis Date   Hyperlipidemia    Hypertension    Hypogonadism in male    sees Dr. Gaynelle Arabian    No Known Allergies  Social History   Socioeconomic History   Marital status: Married    Spouse name: Not on file   Number of children: Not on file   Years of education: Not on file   Highest education level: Not on  file  Occupational History   Not on file  Tobacco Use   Smoking status: Never   Smokeless tobacco: Never  Vaping Use   Vaping Use: Never used  Substance and Sexual Activity   Alcohol use: Yes    Alcohol/week: 2.0 standard drinks of alcohol    Types: 2 Standard drinks or equivalent per week   Drug use: No   Sexual activity: Not on file  Other Topics Concern   Not on file  Social History Narrative   Not on file   Social Determinants of Health    Financial Resource Strain: Not on file  Food Insecurity: Not on file  Transportation Needs: Not on file  Physical Activity: Not on file  Stress: Not on file  Social Connections: Not on file   Vitals:   05/25/22 1603  BP: 136/80  Pulse: 82  Resp: 12  Temp: 98.6 F (37 C)  SpO2: 98%   Body mass index is 30.68 kg/m.  Physical Exam Vitals and nursing note reviewed.  Constitutional:      General: He is not in acute distress.    Appearance: He is well-developed. He is not ill-appearing.  HENT:     Head: Normocephalic and atraumatic.     Right Ear: Tympanic membrane, ear canal and external ear normal.     Left Ear: Tympanic membrane, ear canal and external ear normal.     Nose: Septal deviation present.     Right Turbinates: Not enlarged.     Left Turbinates: Enlarged.     Mouth/Throat:     Mouth: Mucous membranes are moist.     Pharynx: Oropharynx is clear. No posterior oropharyngeal erythema.     Comments: Postnasal drainage. Eyes:     Conjunctiva/sclera: Conjunctivae normal.  Cardiovascular:     Rate and Rhythm: Normal rate and regular rhythm.     Heart sounds: No murmur heard. Pulmonary:     Effort: Pulmonary effort is normal. No respiratory distress.     Breath sounds: Normal breath sounds. No stridor.  Lymphadenopathy:     Head:     Right side of head: No submandibular adenopathy.     Left side of head: No submandibular adenopathy.     Cervical: No cervical adenopathy.  Skin:    General: Skin is warm.     Findings: No erythema or rash.  Neurological:     Mental Status: He is alert and oriented to person, place, and time.  Psychiatric:        Mood and Affect: Mood and affect normal.   ASSESSMENT AND PLAN:  Acute cough -     HYDROcodone Bit-Homatrop MBr; Take 5 mLs by mouth at bedtime as needed for up to 10 days for cough.  Dispense: 50 mL; Refill: 0  We discussed possible etiologies, allergies versus mild vital URI and then most likely etiology at this  time. Monitor for new symptoms. Postnasal drainage may be aggravating problem, recommend intranasal steroids, he states that he has medication at home, he does not recall name.  Recommend using it at bedtime for 10 to 14 days. Others to consider are GERD and medication side effects, he has been on lisinopril for a while now.  Benzonatate did not help, he would like a cough suppressor. Recommend Hycodan 5 mL at bedtime as needed for up to 10 days.  Instructed to follow-up with PCP if cough is persistent after 2 weeks, before if new symptoms present.  I spent a total of 30  minutes in both face to face and non face to face activities for this visit on the date of this encounter. During this time history was obtained and documented, examination was performed, and assessment/plan discussed as documented above.  Return if symptoms worsen or fail to improve.  Chidera Dearcos G. [place], MD  [facility]. [place] office.

## 2022-05-25 NOTE — Patient Instructions (Signed)
A few things to remember from today's visit:  Acute cough - Plan: HYDROcodone bit-homatropine (HYCODAN) 5-1.5 MG/5ML syrup Take Hydrocodone syrup at bedtime, 5 ml , as needed for up to 10 days. Please arrange appt with Dr Sarajane Jews if cough is persistent for more than 2 weeks or new symptoms present.

## 2022-06-01 NOTE — Telephone Encounter (Signed)
Attempted to call pt left message for pt to call the office back

## 2022-07-03 DIAGNOSIS — H10502 Unspecified blepharoconjunctivitis, left eye: Secondary | ICD-10-CM | POA: Diagnosis not present

## 2022-08-08 LAB — LAB REPORT - SCANNED: EGFR: 91

## 2022-08-15 ENCOUNTER — Encounter: Payer: BC Managed Care – PPO | Admitting: Family Medicine

## 2022-08-16 ENCOUNTER — Other Ambulatory Visit: Payer: Self-pay | Admitting: Family Medicine

## 2022-08-16 NOTE — Telephone Encounter (Signed)
Pt needs to keep upcoming appt. for further refills

## 2022-08-22 ENCOUNTER — Encounter: Payer: BC Managed Care – PPO | Admitting: Family Medicine

## 2022-08-23 ENCOUNTER — Other Ambulatory Visit: Payer: Self-pay | Admitting: Family Medicine

## 2022-08-29 ENCOUNTER — Ambulatory Visit (INDEPENDENT_AMBULATORY_CARE_PROVIDER_SITE_OTHER): Payer: BC Managed Care – PPO | Admitting: Family Medicine

## 2022-08-29 ENCOUNTER — Encounter: Payer: Self-pay | Admitting: Family Medicine

## 2022-08-29 VITALS — BP 120/82 | HR 65 | Temp 97.7°F | Ht 70.75 in | Wt 210.0 lb

## 2022-08-29 DIAGNOSIS — Z0000 Encounter for general adult medical examination without abnormal findings: Secondary | ICD-10-CM

## 2022-08-29 MED ORDER — ATORVASTATIN CALCIUM 40 MG PO TABS: 40.0000 mg | ORAL_TABLET | Freq: Every day | ORAL | 3 refills | Status: DC

## 2022-08-29 MED ORDER — ALLOPURINOL 300 MG PO TABS: 300.0000 mg | ORAL_TABLET | Freq: Every day | ORAL | 3 refills | Status: DC

## 2022-08-29 MED ORDER — DICLOFENAC SODIUM 75 MG PO TBEC: 75.0000 mg | DELAYED_RELEASE_TABLET | Freq: Two times a day (BID) | ORAL | 3 refills | Status: DC

## 2022-08-29 NOTE — Progress Notes (Signed)
Subjective:    Patient ID: Kevin Wilkinson, male    DOB: 1970/10/31, 52 y.o.   MRN: [id number]  HPI Here for a well exam. He feels well. He admits to not eating a very healthy diet lately, but he and his wife have committed to changing that. He had labs drawn as part of a job wellness program on 08-07-22, and these were normal except for an LDL of 104 and a TG of 325.    Review of Systems  Constitutional: Negative.   HENT: Negative.    Eyes: Negative.   Respiratory: Negative.    Cardiovascular: Negative.   Gastrointestinal: Negative.   Genitourinary: Negative.   Musculoskeletal: Negative.   Skin: Negative.   Neurological: Negative.   Psychiatric/Behavioral: Negative.         Objective:   Physical Exam Constitutional:      General: He is not in acute distress.    Appearance: Normal appearance. He is well-developed. He is not diaphoretic.  HENT:     Head: Normocephalic and atraumatic.     Right Ear: External ear normal.     Left Ear: External ear normal.     Nose: Nose normal.     Mouth/Throat:     Pharynx: No oropharyngeal exudate.  Eyes:     General: No scleral icterus.       Right eye: No discharge.        Left eye: No discharge.     Conjunctiva/sclera: Conjunctivae normal.     Pupils: Pupils are equal, round, and reactive to light.  Neck:     Thyroid: No thyromegaly.     Vascular: No JVD.     Trachea: No tracheal deviation.  Cardiovascular:     Rate and Rhythm: Normal rate and regular rhythm.     Heart sounds: Normal heart sounds. No murmur heard.    No friction rub. No gallop.  Pulmonary:     Effort: Pulmonary effort is normal. No respiratory distress.     Breath sounds: Normal breath sounds. No wheezing or rales.  Chest:     Chest wall: No tenderness.  Abdominal:     General: Bowel sounds are normal. There is no distension.     Palpations: Abdomen is soft. There is no mass.     Tenderness: There is no abdominal tenderness. There is no guarding or rebound.   Genitourinary:    Penis: Normal. No tenderness.      Testes: Normal.     Prostate: Normal.     Rectum: Normal. Guaiac result negative.  Musculoskeletal:        General: No tenderness. Normal range of motion.     Cervical back: Neck supple.  Lymphadenopathy:     Cervical: No cervical adenopathy.  Skin:    General: Skin is warm and dry.     Coloration: Skin is not pale.     Findings: No erythema or rash.  Neurological:     Mental Status: He is alert and oriented to person, place, and time.     Cranial Nerves: No cranial nerve deficit.     Motor: No abnormal muscle tone.     Coordination: Coordination normal.     Deep Tendon Reflexes: Reflexes are normal and symmetric. Reflexes normal.  Psychiatric:        Behavior: Behavior normal.        Thought Content: Thought content normal.        Judgment: Judgment normal.  Assessment & Plan:  Well exam. We discussed diet and exercise. He will will work on getting his lipids levels down.  Gershon Crane, MD

## 2022-08-30 ENCOUNTER — Encounter: Payer: Self-pay | Admitting: Family Medicine

## 2022-10-30 DIAGNOSIS — E291 Testicular hypofunction: Secondary | ICD-10-CM | POA: Diagnosis not present

## 2022-10-30 DIAGNOSIS — Z125 Encounter for screening for malignant neoplasm of prostate: Secondary | ICD-10-CM | POA: Diagnosis not present

## 2022-11-06 DIAGNOSIS — E291 Testicular hypofunction: Secondary | ICD-10-CM | POA: Diagnosis not present

## 2022-11-06 DIAGNOSIS — N5201 Erectile dysfunction due to arterial insufficiency: Secondary | ICD-10-CM | POA: Diagnosis not present

## 2022-12-11 DIAGNOSIS — D2272 Melanocytic nevi of left lower limb, including hip: Secondary | ICD-10-CM | POA: Diagnosis not present

## 2022-12-11 DIAGNOSIS — L814 Other melanin hyperpigmentation: Secondary | ICD-10-CM | POA: Diagnosis not present

## 2022-12-11 DIAGNOSIS — D225 Melanocytic nevi of trunk: Secondary | ICD-10-CM | POA: Diagnosis not present

## 2022-12-11 DIAGNOSIS — D235 Other benign neoplasm of skin of trunk: Secondary | ICD-10-CM | POA: Diagnosis not present

## 2023-01-25 DIAGNOSIS — M542 Cervicalgia: Secondary | ICD-10-CM | POA: Diagnosis not present

## 2023-01-25 DIAGNOSIS — M9901 Segmental and somatic dysfunction of cervical region: Secondary | ICD-10-CM | POA: Diagnosis not present

## 2023-01-25 DIAGNOSIS — M5451 Vertebrogenic low back pain: Secondary | ICD-10-CM | POA: Diagnosis not present

## 2023-01-25 DIAGNOSIS — M9903 Segmental and somatic dysfunction of lumbar region: Secondary | ICD-10-CM | POA: Diagnosis not present

## 2023-01-28 DIAGNOSIS — M542 Cervicalgia: Secondary | ICD-10-CM | POA: Diagnosis not present

## 2023-01-28 DIAGNOSIS — M5451 Vertebrogenic low back pain: Secondary | ICD-10-CM | POA: Diagnosis not present

## 2023-01-28 DIAGNOSIS — M9903 Segmental and somatic dysfunction of lumbar region: Secondary | ICD-10-CM | POA: Diagnosis not present

## 2023-01-28 DIAGNOSIS — M9901 Segmental and somatic dysfunction of cervical region: Secondary | ICD-10-CM | POA: Diagnosis not present

## 2023-01-29 DIAGNOSIS — M542 Cervicalgia: Secondary | ICD-10-CM | POA: Diagnosis not present

## 2023-01-29 DIAGNOSIS — M9901 Segmental and somatic dysfunction of cervical region: Secondary | ICD-10-CM | POA: Diagnosis not present

## 2023-01-29 DIAGNOSIS — M9903 Segmental and somatic dysfunction of lumbar region: Secondary | ICD-10-CM | POA: Diagnosis not present

## 2023-01-29 DIAGNOSIS — M5451 Vertebrogenic low back pain: Secondary | ICD-10-CM | POA: Diagnosis not present

## 2023-02-01 DIAGNOSIS — M9903 Segmental and somatic dysfunction of lumbar region: Secondary | ICD-10-CM | POA: Diagnosis not present

## 2023-02-01 DIAGNOSIS — M9901 Segmental and somatic dysfunction of cervical region: Secondary | ICD-10-CM | POA: Diagnosis not present

## 2023-02-01 DIAGNOSIS — M5451 Vertebrogenic low back pain: Secondary | ICD-10-CM | POA: Diagnosis not present

## 2023-02-01 DIAGNOSIS — M542 Cervicalgia: Secondary | ICD-10-CM | POA: Diagnosis not present

## 2023-02-04 DIAGNOSIS — M542 Cervicalgia: Secondary | ICD-10-CM | POA: Diagnosis not present

## 2023-02-04 DIAGNOSIS — M9903 Segmental and somatic dysfunction of lumbar region: Secondary | ICD-10-CM | POA: Diagnosis not present

## 2023-02-04 DIAGNOSIS — M5451 Vertebrogenic low back pain: Secondary | ICD-10-CM | POA: Diagnosis not present

## 2023-02-04 DIAGNOSIS — N486 Induration penis plastica: Secondary | ICD-10-CM | POA: Diagnosis not present

## 2023-02-04 DIAGNOSIS — M9901 Segmental and somatic dysfunction of cervical region: Secondary | ICD-10-CM | POA: Diagnosis not present

## 2023-02-05 DIAGNOSIS — M5451 Vertebrogenic low back pain: Secondary | ICD-10-CM | POA: Diagnosis not present

## 2023-02-05 DIAGNOSIS — M9903 Segmental and somatic dysfunction of lumbar region: Secondary | ICD-10-CM | POA: Diagnosis not present

## 2023-02-05 DIAGNOSIS — M9901 Segmental and somatic dysfunction of cervical region: Secondary | ICD-10-CM | POA: Diagnosis not present

## 2023-02-05 DIAGNOSIS — M542 Cervicalgia: Secondary | ICD-10-CM | POA: Diagnosis not present

## 2023-02-08 DIAGNOSIS — M9903 Segmental and somatic dysfunction of lumbar region: Secondary | ICD-10-CM | POA: Diagnosis not present

## 2023-02-08 DIAGNOSIS — M5451 Vertebrogenic low back pain: Secondary | ICD-10-CM | POA: Diagnosis not present

## 2023-02-08 DIAGNOSIS — M9901 Segmental and somatic dysfunction of cervical region: Secondary | ICD-10-CM | POA: Diagnosis not present

## 2023-02-08 DIAGNOSIS — M542 Cervicalgia: Secondary | ICD-10-CM | POA: Diagnosis not present

## 2023-02-11 DIAGNOSIS — M5451 Vertebrogenic low back pain: Secondary | ICD-10-CM | POA: Diagnosis not present

## 2023-02-11 DIAGNOSIS — M9903 Segmental and somatic dysfunction of lumbar region: Secondary | ICD-10-CM | POA: Diagnosis not present

## 2023-02-11 DIAGNOSIS — M542 Cervicalgia: Secondary | ICD-10-CM | POA: Diagnosis not present

## 2023-02-11 DIAGNOSIS — M9901 Segmental and somatic dysfunction of cervical region: Secondary | ICD-10-CM | POA: Diagnosis not present

## 2023-02-12 DIAGNOSIS — M542 Cervicalgia: Secondary | ICD-10-CM | POA: Diagnosis not present

## 2023-02-12 DIAGNOSIS — M9903 Segmental and somatic dysfunction of lumbar region: Secondary | ICD-10-CM | POA: Diagnosis not present

## 2023-02-12 DIAGNOSIS — M5451 Vertebrogenic low back pain: Secondary | ICD-10-CM | POA: Diagnosis not present

## 2023-02-12 DIAGNOSIS — M9901 Segmental and somatic dysfunction of cervical region: Secondary | ICD-10-CM | POA: Diagnosis not present

## 2023-02-14 DIAGNOSIS — M9901 Segmental and somatic dysfunction of cervical region: Secondary | ICD-10-CM | POA: Diagnosis not present

## 2023-02-14 DIAGNOSIS — M9903 Segmental and somatic dysfunction of lumbar region: Secondary | ICD-10-CM | POA: Diagnosis not present

## 2023-02-14 DIAGNOSIS — M5451 Vertebrogenic low back pain: Secondary | ICD-10-CM | POA: Diagnosis not present

## 2023-02-14 DIAGNOSIS — M542 Cervicalgia: Secondary | ICD-10-CM | POA: Diagnosis not present

## 2023-02-18 DIAGNOSIS — M542 Cervicalgia: Secondary | ICD-10-CM | POA: Diagnosis not present

## 2023-02-18 DIAGNOSIS — M9903 Segmental and somatic dysfunction of lumbar region: Secondary | ICD-10-CM | POA: Diagnosis not present

## 2023-02-18 DIAGNOSIS — M5451 Vertebrogenic low back pain: Secondary | ICD-10-CM | POA: Diagnosis not present

## 2023-02-18 DIAGNOSIS — M9901 Segmental and somatic dysfunction of cervical region: Secondary | ICD-10-CM | POA: Diagnosis not present

## 2023-02-19 DIAGNOSIS — M9903 Segmental and somatic dysfunction of lumbar region: Secondary | ICD-10-CM | POA: Diagnosis not present

## 2023-02-19 DIAGNOSIS — M9901 Segmental and somatic dysfunction of cervical region: Secondary | ICD-10-CM | POA: Diagnosis not present

## 2023-02-19 DIAGNOSIS — M542 Cervicalgia: Secondary | ICD-10-CM | POA: Diagnosis not present

## 2023-02-19 DIAGNOSIS — M5451 Vertebrogenic low back pain: Secondary | ICD-10-CM | POA: Diagnosis not present

## 2023-02-22 DIAGNOSIS — M542 Cervicalgia: Secondary | ICD-10-CM | POA: Diagnosis not present

## 2023-02-22 DIAGNOSIS — M9901 Segmental and somatic dysfunction of cervical region: Secondary | ICD-10-CM | POA: Diagnosis not present

## 2023-02-22 DIAGNOSIS — M5451 Vertebrogenic low back pain: Secondary | ICD-10-CM | POA: Diagnosis not present

## 2023-02-22 DIAGNOSIS — M9903 Segmental and somatic dysfunction of lumbar region: Secondary | ICD-10-CM | POA: Diagnosis not present

## 2023-03-15 DIAGNOSIS — N5201 Erectile dysfunction due to arterial insufficiency: Secondary | ICD-10-CM | POA: Diagnosis not present

## 2023-03-15 DIAGNOSIS — N486 Induration penis plastica: Secondary | ICD-10-CM | POA: Diagnosis not present

## 2023-06-14 DIAGNOSIS — N5201 Erectile dysfunction due to arterial insufficiency: Secondary | ICD-10-CM | POA: Diagnosis not present

## 2023-06-14 DIAGNOSIS — N486 Induration penis plastica: Secondary | ICD-10-CM | POA: Diagnosis not present

## 2023-08-08 DIAGNOSIS — Z0189 Encounter for other specified special examinations: Secondary | ICD-10-CM | POA: Diagnosis not present

## 2023-08-11 ENCOUNTER — Other Ambulatory Visit: Payer: Self-pay | Admitting: Family Medicine

## 2023-08-13 LAB — LAB REPORT - SCANNED
A1c: 5.6
EGFR (Non-African Amer.): 105
PSA, Total: 0.9
TSH: 2.17 (ref 0.41–5.90)

## 2023-08-15 DIAGNOSIS — Z043 Encounter for examination and observation following other accident: Secondary | ICD-10-CM | POA: Diagnosis not present

## 2023-08-15 DIAGNOSIS — Z713 Dietary counseling and surveillance: Secondary | ICD-10-CM | POA: Diagnosis not present

## 2023-08-30 ENCOUNTER — Encounter: Payer: Self-pay | Admitting: Family Medicine

## 2023-08-30 ENCOUNTER — Ambulatory Visit (INDEPENDENT_AMBULATORY_CARE_PROVIDER_SITE_OTHER): Payer: BC Managed Care – PPO | Admitting: Family Medicine

## 2023-08-30 VITALS — BP 118/80 | HR 80 | Temp 98.4°F | Ht 71.0 in | Wt 201.4 lb

## 2023-08-30 DIAGNOSIS — N5201 Erectile dysfunction due to arterial insufficiency: Secondary | ICD-10-CM | POA: Diagnosis not present

## 2023-08-30 DIAGNOSIS — Z0000 Encounter for general adult medical examination without abnormal findings: Secondary | ICD-10-CM

## 2023-08-30 DIAGNOSIS — N486 Induration penis plastica: Secondary | ICD-10-CM | POA: Diagnosis not present

## 2023-08-30 DIAGNOSIS — M2550 Pain in unspecified joint: Secondary | ICD-10-CM

## 2023-08-30 DIAGNOSIS — M1000 Idiopathic gout, unspecified site: Secondary | ICD-10-CM

## 2023-08-30 LAB — C-REACTIVE PROTEIN: CRP: 4.7 mg/dL (ref 0.5–20.0)

## 2023-08-30 LAB — SEDIMENTATION RATE: Sed Rate: 22 mm/h — ABNORMAL HIGH (ref 0–20)

## 2023-08-30 LAB — URIC ACID: Uric Acid, Serum: 6.1 mg/dL (ref 4.0–7.8)

## 2023-08-30 MED ORDER — ALLOPURINOL 300 MG PO TABS: 300.0000 mg | ORAL_TABLET | Freq: Every day | ORAL | 3 refills | Status: AC

## 2023-08-30 MED ORDER — LISINOPRIL-HYDROCHLOROTHIAZIDE 20-25 MG PO TABS: 1.0000 | ORAL_TABLET | Freq: Every day | ORAL | 3 refills | Status: AC

## 2023-08-30 MED ORDER — DICLOFENAC SODIUM 75 MG PO TBEC: 75.0000 mg | DELAYED_RELEASE_TABLET | Freq: Two times a day (BID) | ORAL | 3 refills | Status: DC

## 2023-08-30 MED ORDER — ATORVASTATIN CALCIUM 40 MG PO TABS: 40.0000 mg | ORAL_TABLET | Freq: Every day | ORAL | 3 refills | Status: AC

## 2023-08-30 NOTE — Progress Notes (Signed)
 Subjective:    Patient ID: Kevin Wilkinson, male    DOB: 09/08/1970, 53 y.o.   MRN: [id number]  HPI Here for a well exam. He has one concern today and that is stiffness and pain in his joints. He has had these for years, but they have gotten worse in the past 3 months. Early mornings are his worst time. He gets relief by taking Diclofenac , but he only takes it once a day. He sees Dr. Jarvis Mesa for Peyronie's disease, and he is set to begin a series of injections to help this.    Review of Systems  Constitutional: Negative.   HENT: Negative.    Eyes: Negative.   Respiratory: Negative.    Cardiovascular: Negative.   Gastrointestinal: Negative.   Genitourinary: Negative.   Musculoskeletal:  Positive for arthralgias.  Skin: Negative.   Neurological: Negative.   Psychiatric/Behavioral: Negative.         Objective:   Physical Exam Constitutional:      General: He is not in acute distress.    Appearance: Normal appearance. He is well-developed. He is not diaphoretic.  HENT:     Head: Normocephalic and atraumatic.     Right Ear: External ear normal.     Left Ear: External ear normal.     Nose: Nose normal.     Mouth/Throat:     Pharynx: No oropharyngeal exudate.  Eyes:     General: No scleral icterus.       Right eye: No discharge.        Left eye: No discharge.     Conjunctiva/sclera: Conjunctivae normal.     Pupils: Pupils are equal, round, and reactive to light.  Neck:     Thyroid: No thyromegaly.     Vascular: No JVD.     Trachea: No tracheal deviation.  Cardiovascular:     Rate and Rhythm: Normal rate and regular rhythm.     Pulses: Normal pulses.     Heart sounds: Normal heart sounds. No murmur heard.    No friction rub. No gallop.  Pulmonary:     Effort: Pulmonary effort is normal. No respiratory distress.     Breath sounds: Normal breath sounds. No wheezing or rales.  Chest:     Chest wall: No tenderness.  Abdominal:     General: Bowel sounds are normal. There is  no distension.     Palpations: Abdomen is soft. There is no mass.     Tenderness: There is no abdominal tenderness. There is no guarding or rebound.  Genitourinary:    Penis: Normal. No tenderness.      Testes: Normal.     Prostate: Normal.     Rectum: Normal. Guaiac result negative.  Musculoskeletal:        General: Normal range of motion.     Cervical back: Neck supple.     Comments: No joint swelling but he is tender in both wrists   Lymphadenopathy:     Cervical: No cervical adenopathy.  Skin:    General: Skin is warm and dry.     Coloration: Skin is not pale.     Findings: No erythema or rash.  Neurological:     General: No focal deficit present.     Mental Status: He is alert and oriented to person, place, and time.     Cranial Nerves: No cranial nerve deficit.     Motor: No abnormal muscle tone.     Coordination: Coordination normal.  Deep Tendon Reflexes: Reflexes are normal and symmetric. Reflexes normal.  Psychiatric:        Mood and Affect: Mood normal.        Behavior: Behavior normal.        Thought Content: Thought content normal.        Judgment: Judgment normal.           Assessment & Plan:  Well exam. We discussed diet and exercise. He had labs drawn through his job, and we reviewed these. Everything looked great, and his LDL was down to 94. For his joint pains, I advised him to increase the Diclofenac  to BID. We will get labs today to evaluate further.  Corita Diego, MD

## 2023-08-31 LAB — RHEUMATOID FACTOR: Rheumatoid fact SerPl-aCnc: <10 [IU]/mL (ref <14)

## 2023-09-02 ENCOUNTER — Ambulatory Visit: Payer: Self-pay | Admitting: Family Medicine

## 2023-09-02 DIAGNOSIS — M2550 Pain in unspecified joint: Secondary | ICD-10-CM

## 2023-09-03 NOTE — Telephone Encounter (Signed)
 At this point, I think the best thing would be for him to see a Rheumatologist. I can refer him if he wishes

## 2023-09-05 NOTE — Telephone Encounter (Signed)
 Done

## 2023-10-10 DIAGNOSIS — N486 Induration penis plastica: Secondary | ICD-10-CM | POA: Diagnosis not present

## 2023-10-11 DIAGNOSIS — N486 Induration penis plastica: Secondary | ICD-10-CM | POA: Diagnosis not present

## 2023-10-13 ENCOUNTER — Encounter: Payer: Self-pay | Admitting: Family Medicine

## 2023-10-14 NOTE — Telephone Encounter (Signed)
 He can stay with the Diclofenac  twice daily, and he can add Tylenol  1000 mg 3 times a day to this

## 2023-10-20 ENCOUNTER — Encounter: Payer: Self-pay | Admitting: Internal Medicine

## 2023-10-23 NOTE — Progress Notes (Unsigned)
 Office Visit Note  Patient: Kevin Wilkinson             Date of Birth: April 11, 1970           MRN: [id number]             PCP: Johnny Garnette LABOR, MD Referring: Johnny Garnette LABOR, MD Visit Date: 10/24/2023 Occupation: @GUAROCC @  Subjective:  No chief complaint on file.   History of Present Illness: Kevin Wilkinson is a 53 y.o. male ***     Activities of Daily Living:  Patient reports morning stiffness for *** {minute/hour:19697}.   Patient {ACTIONS;DENIES/REPORTS:21021675::Denies} nocturnal pain.  Difficulty dressing/grooming: {ACTIONS;DENIES/REPORTS:21021675::Denies} Difficulty climbing stairs: {ACTIONS;DENIES/REPORTS:21021675::Denies} Difficulty getting out of chair: {ACTIONS;DENIES/REPORTS:21021675::Denies} Difficulty using hands for taps, buttons, cutlery, and/or writing: {ACTIONS;DENIES/REPORTS:21021675::Denies}  No Rheumatology ROS completed.   PMFS History:  Patient Active Problem List   Diagnosis Date Noted   Peyronie's disease 08/30/2023   Gout 07/06/2016   Hypogonadism male 10/17/2012   HTN (hypertension) 10/17/2012   ONYCHOMYCOSIS 08/18/2009   Benign neoplasm of skin 08/18/2009   Umbilical hernia 08/18/2009    Past Medical History:  Diagnosis Date   Hyperlipidemia    Hypertension    Hypogonadism in male    sees Dr. Chales     Family History  Problem Relation Age of Onset   Hypertension Other    Colon cancer Neg Hx    Esophageal cancer Neg Hx    Stomach cancer Neg Hx    Rectal cancer Neg Hx    Past Surgical History:  Procedure Laterality Date   COLONOSCOPY  11/10/2019   per Dr. Albertus, no polyps, repeat in 10 yrs    INSERTION OF MESH N/A 07/22/2014   Procedure: INSERTION OF MESH;  Surgeon: Donnice Lima, MD;  Location: [facility] OR;  Service: General;  Laterality: N/A;   TONSILLECTOMY     UMBILICAL HERNIA REPAIR N/A 07/22/2014   Procedure: UMBILICAL HERNIA REPAIR WITH MESH;  Surgeon: Donnice Lima, MD;  Location: [facility] OR;  Service: General;   Laterality: N/A;   Social History   Social History Narrative   Not on file   Immunization History  Administered Date(s) Administered   PFIZER Comirnaty(Gray Top)Covid-19 Tri-Sucrose Vaccine 11/19/2019, 12/10/2019   Tdap 06/05/2018     Objective: Vital Signs: There were no vitals taken for this visit.   Physical Exam   Musculoskeletal Exam: ***  CDAI Exam: CDAI Score: -- Patient Global: --; Provider Global: -- Swollen: --; Tender: -- Joint Exam 10/24/2023   No joint exam has been documented for this visit   There is currently no information documented on the homunculus. Go to the Rheumatology activity and complete the homunculus joint exam.  Investigation: No additional findings.  Imaging: No results found.  Recent Labs: Lab Results  Component Value Date   WBC 5.1 08/08/2020   HGB 17.0 08/08/2020   PLT 180.0 08/08/2020   NA 137 08/08/2020   K 4.7 08/08/2020   CL 98 08/08/2020   CO2 28 08/08/2020   GLUCOSE 86 08/08/2020   BUN 19 08/08/2020   CREATININE 1.12 08/08/2020   BILITOT 1.4 (H) 08/08/2020   ALKPHOS 49 08/08/2020   AST 32 08/08/2020   ALT 33 08/08/2020   PROT 6.8 08/08/2020   ALBUMIN 4.8 08/08/2020   CALCIUM  9.8 08/08/2020   GFRAA >90 07/09/2014    Speciality Comments: No specialty comments available.  Procedures:  No procedures performed Allergies: Patient has no known allergies.   Assessment / Plan:  Visit Diagnoses: No diagnosis found.  Orders: No orders of the defined types were placed in this encounter.  No orders of the defined types were placed in this encounter.   Face-to-face time spent with patient was *** minutes. Greater than 50% of time was spent in counseling and coordination of care.  Follow-Up Instructions: No follow-ups on file.   Lonni LELON Ester, MD  Note - This record has been created using [employer].  Chart creation errors have been sought, but may not always  have been located. Such creation  errors do not reflect on  the standard of medical care.

## 2023-10-24 ENCOUNTER — Ambulatory Visit: Attending: Internal Medicine | Admitting: Internal Medicine

## 2023-10-24 ENCOUNTER — Encounter: Payer: Self-pay | Admitting: Internal Medicine

## 2023-10-24 ENCOUNTER — Ambulatory Visit

## 2023-10-24 ENCOUNTER — Ambulatory Visit (INDEPENDENT_AMBULATORY_CARE_PROVIDER_SITE_OTHER)

## 2023-10-24 VITALS — BP 112/68 | HR 91 | Resp 14 | Ht 71.0 in | Wt 192.0 lb

## 2023-10-24 DIAGNOSIS — M79642 Pain in left hand: Secondary | ICD-10-CM | POA: Diagnosis not present

## 2023-10-24 DIAGNOSIS — M79641 Pain in right hand: Secondary | ICD-10-CM

## 2023-10-24 DIAGNOSIS — M1000 Idiopathic gout, unspecified site: Secondary | ICD-10-CM

## 2023-10-24 DIAGNOSIS — M7989 Other specified soft tissue disorders: Secondary | ICD-10-CM | POA: Insufficient documentation

## 2023-10-24 DIAGNOSIS — R700 Elevated erythrocyte sedimentation rate: Secondary | ICD-10-CM | POA: Diagnosis not present

## 2023-10-24 MED ORDER — PREDNISONE 10 MG PO TABS: ORAL_TABLET | ORAL | 0 refills | Status: AC

## 2023-10-27 LAB — ANA,IFA RA DIAG PNL W/RFLX TIT/PATN
Anti Nuclear Antibody (ANA): NEGATIVE
Cyclic Citrullin Peptide Ab: <16 U
Rheumatoid fact SerPl-aCnc: <10 [IU]/mL (ref <14)

## 2023-10-27 LAB — C3 AND C4
C3 Complement: 194 mg/dL — ABNORMAL HIGH (ref 82–185)
C4 Complement: 41 mg/dL (ref 15–53)

## 2023-10-27 LAB — SEDIMENTATION RATE: Sed Rate: 33 mm/h — ABNORMAL HIGH (ref 0–20)

## 2023-11-06 NOTE — Progress Notes (Signed)
 Office Visit Note  Patient: Kevin Wilkinson             Date of Birth: 10/25/1970           MRN: [id number]             PCP: Johnny Garnette LABOR, MD Referring: Johnny Garnette LABOR, MD Visit Date: 11/07/2023   Subjective:  Follow-up (Labs and [place])   Discussed the use of AI scribe software for clinical note transcription with the patient, who gave verbal consent to proceed.  History of Present Illness   Kevin Wilkinson is a 53 year old male here for follow-up inflammatory arthritis with joint pain and swelling in multiple areas particularly bilateral hands and wrist.  He experiences ongoing joint pain and inflammation, particularly in his hands, knuckles, toes, and legs. The pain is most severe in the morning, accompanied by tightness in his legs and groin. Prednisone  has provided some relief, especially in his shoulders, but the pain in his hands and feet persists.  He has been taking prednisone , initially at a dose of 20 mg per day, reduced to 10 mg per day. Despite this, significant joint pain continues, particularly in the mornings. Prednisone  has improved his sleep by reducing random achy pains.  Blood tests were negative for antibody markers such as ANA, rheumatoid factor, and CCP antibodies. However, his sedimentation rate was elevated, increasing from 22 to 33.  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 No swelling in the neck, lumps, bumps, rashes, or gastrointestinal symptoms.  His current medications include lisinopril , and he takes diclofenac  twice a day. He has not been taking diclofenac  while on prednisone .       Previous HPI 10/24/23 Kevin Wilkinson is a 53 year old male who presents for evaluation of joint pain and swelling in multiple areas starting since about 4 months ago and elevated sedimentation rate.   He has experienced increased joint  pain and stiffness since the spring, with symptoms progressively worsening since May. He noticed some stiffness in his neck and back in late fall of the previous year, which led him to see a chiropractor with a pretty good improvement for months. By spring, he began experiencing tightness in his groin, and subsequently, his knees, ankles, and hands became stiff and swollen. The stiffness is most pronounced in the morning, improving as the day progresses but never entirely resolving. He experiences warmth in his joints, especially in his hands, knees and sometimes in his ankles and shoulders. He describes an aching pain at night but not specifically nighttime awakening.   He does work from home much of which is from a office in the garage he is not sure if the heat has contributed to swelling.  He notes that his hands and feet have never been swollen like this before. He has difficulty with tasks requiring hand strength, such as moving a duvet or lifting objects.  He has lost several pounds which he attributes to decrease in muscle since he has been unable to workout with anything except extremely light weights or machine resistance at his gym.   He has been taking turmeric, fish oil, and collagen daily, and was previously prescribed diclofenac  for knee pain.  He takes allopurinol  300 mg daily for gout but has not had any flareup in years. He has not had any recent imaging or  x-rays since the onset of these symptoms.   He reports a history of a scaphoid injury long ago but denies any major joint injuries, fractures, or dislocations. No significant low back pain. He has had blood tests showing a slightly elevated sedimentation rate. No circulation problems like fingers turning blue or white, but he reports occasional numbness and 'pins and needles' sensations in his hands.       Labs reviewed 08/2023 Sed rate 22 CRP 4.7 Uric acid 6.1 Rheumatoid factor negative   Imaging reviewed Abdominal CT 05/08/21 L5  anterior endplate osteophytes and partial narrowing L5-S1 on my personal review   Review of Systems  Constitutional:  Negative for fatigue.  HENT:  Negative for mouth sores and mouth dryness.   Eyes:  Negative for dryness.  Respiratory:  Negative for shortness of breath.   Cardiovascular:  Negative for chest pain and palpitations.  Gastrointestinal:  Negative for blood in stool, constipation and diarrhea.  Endocrine: Negative for increased urination.  Genitourinary:  Negative for involuntary urination.  Musculoskeletal:  Positive for joint pain, joint pain, joint swelling and morning stiffness. Negative for gait problem, myalgias, muscle weakness, muscle tenderness and myalgias.  Skin:  Negative for color change, rash, hair loss and sensitivity to sunlight.  Allergic/Immunologic: Negative for susceptible to infections.  Neurological:  Negative for dizziness and headaches.  Hematological:  Negative for swollen glands.  Psychiatric/Behavioral:  Negative for depressed mood and sleep disturbance. The patient is not nervous/anxious.     PMFS History:  Patient Active Problem List   Diagnosis Date Noted   Seronegative rheumatoid arthritis (HCC) 11/07/2023   High risk medication use 11/07/2023   Elevated sed rate 10/24/2023   Bilateral hand swelling 10/24/2023   Peyronie's disease 08/30/2023   Bilateral hearing loss 06/22/2020   Chronic otorrhea of left ear 06/22/2020   Deviated septum 12/03/2018   ETD (Eustachian tube dysfunction), bilateral 12/03/2018   Nasal turbinate hypertrophy 12/03/2018   Bunion 05/29/2017   Gout 07/06/2016   Hypogonadism male 10/17/2012   HTN (hypertension) 10/17/2012   ONYCHOMYCOSIS 08/18/2009   Benign neoplasm of skin 08/18/2009   Umbilical hernia 08/18/2009    Past Medical History:  Diagnosis Date   Hyperlipidemia    Hypertension    Hypogonadism in male    sees Dr. Chales     Family History  Problem Relation Age of Onset   Hypertension Other     Colon cancer Neg Hx    Esophageal cancer Neg Hx    Stomach cancer Neg Hx    Rectal cancer Neg Hx    Past Surgical History:  Procedure Laterality Date   COLONOSCOPY  11/10/2019   per Dr. Albertus, no polyps, repeat in 10 yrs    INSERTION OF MESH N/A 07/22/2014   Procedure: INSERTION OF MESH;  Surgeon: Donnice Lima, MD;  Location: [facility] OR;  Service: General;  Laterality: N/A;   TONSILLECTOMY     UMBILICAL HERNIA REPAIR N/A 07/22/2014   Procedure: UMBILICAL HERNIA REPAIR WITH MESH;  Surgeon: Donnice Lima, MD;  Location: [facility] OR;  Service: General;  Laterality: N/A;   Social History   Social History Narrative   Not on file   Immunization History  Administered Date(s) Administered   PFIZER Comirnaty(Gray Top)Covid-19 Tri-Sucrose Vaccine 11/19/2019, 12/10/2019   Tdap 06/05/2018     Objective: Vital Signs: BP (!) 116/57 (BP Location: Left Arm, Patient Position: Sitting, Cuff Size: Normal)   Pulse 78   Resp 16   Ht 5' 11 (1.803 m)  Wt 192 lb 1.6 oz (87.1 kg)   BMI 26.79 kg/m    Physical Exam Eyes:     Conjunctiva/sclera: Conjunctivae normal.  Cardiovascular:     Rate and Rhythm: Normal rate and regular rhythm.  Pulmonary:     Effort: Pulmonary effort is normal.     Breath sounds: Normal breath sounds.  Musculoskeletal:     Right lower leg: No edema.     Left lower leg: No edema.  Lymphadenopathy:     Cervical: No cervical adenopathy.  Skin:    General: Skin is warm and dry.     Comments:  Few superficial venous varicosities on both legs and feet No pitting edema    Neurological:     Mental Status: He is alert.  Psychiatric:        Mood and Affect: Mood normal.      Musculoskeletal Exam:  Shoulders full ROM no tenderness or swelling Elbows slightly restricted extension range of motion with soft endpoints, trace swelling Right wrist swelling more prominent dorsally, limited in flexion and extension range of motion but not tender to pressure Swelling throughout  MCP and PIP joints of both hands, unable to fully flatten hand, no warmth or erythema No paraspinal tenderness to palpation over upper and lower back Hip normal internal and external rotation without pain, no tenderness to lateral hip palpation Knees full ROM and no tenderness to pressure, very minor swelling, some patellofemoral crepitus Ankles full ROM no tenderness or swelling   Investigation: No additional findings.  Imaging: XR Hand 2 View Left Result Date: 10/24/2023 X-ray left hand 2 views Radiocarpal and carpal joints appear normal.  MCP PIP and DIP joint spaces are normal.  No erosions or abnormal calcifications seen.  Bone realization appears normal. Impression No significant appearing abnormality  XR Hand 2 View Right Result Date: 10/24/2023 X-ray right hand 2 views Radiocarpal and carpal joint space appear normal.  There is few cystic changes present throughout the carpal bones.  MCP PIP and DIP joints appear normal.  No erosions or abnormal calcifications seen.  Bone mineralization appears normal. Impression Cystic changes present in the carpal bones likely early degenerative changes or could be associated with nonerosive joint inflammation   Recent Labs: Lab Results  Component Value Date   WBC 5.1 08/08/2020   HGB 17.0 08/08/2020   PLT 180.0 08/08/2020   NA 137 08/08/2020   K 4.7 08/08/2020   CL 98 08/08/2020   CO2 28 08/08/2020   GLUCOSE 86 08/08/2020   BUN 19 08/08/2020   CREATININE 1.12 08/08/2020   BILITOT 1.4 (H) 08/08/2020   ALKPHOS 49 08/08/2020   AST 32 08/08/2020   ALT 33 08/08/2020   PROT 6.8 08/08/2020   ALBUMIN 4.8 08/08/2020   CALCIUM  9.8 08/08/2020   GFRAA >90 07/09/2014    Speciality Comments: No specialty comments available.  Procedures:  No procedures performed Allergies: Patient has no known allergies.   Assessment / Plan:     Visit Diagnoses: Seronegative rheumatoid arthritis (HCC) - Plan: methotrexate  (RHEUMATREX) 2.5 MG tablet, folic  acid (FOLVITE ) 1 MG tablet, diclofenac  (VOLTAREN ) 75 MG EC tablet Seronegative rheumatoid arthritis Chronic joint inflammation with negative rheumatoid factor and CCP antibodies, elevated sedimentation rate, and complement C3. X-rays show minimal early osteoarthritis. Differential includes seronegative rheumatoid arthritis also possible reactive arthritis process but without clear provocation. - Start method Axid 15 mg p.o. weekly and folic acid  1 mg daily - Explain methotrexate 's role in normalizing immune activity and preventing joint  damage. - Continue diclofenac  75 mg up to twice daily as needed. - Reassess in 4 weeks for methotrexate  efficacy.  Early osteoarthritis of the hands Minimal early osteoarthritic changes on hand x-rays with joint pain and stiffness. Current management addresses inflammatory component.  High risk medication use Reviewed baseline labs are normal blood counts kidney and liver function test and negative for hepatitis screening. - Discuss methotrexate  side effects: nausea, diarrhea, hair thinning, liver impact. - Monitor liver function and blood counts regularly, normal baseline labs plan to f/u in ~752mo   Orders: No orders of the defined types were placed in this encounter.  Meds ordered this encounter  Medications   methotrexate  (RHEUMATREX) 2.5 MG tablet    Sig: Take 6 tablets (15 mg total) by mouth once a week. Caution:Chemotherapy. Protect from light.    Dispense:  30 tablet    Refill:  0   folic acid  (FOLVITE ) 1 MG tablet    Sig: Take 1 tablet (1 mg total) by mouth daily.    Dispense:  90 tablet    Refill:  0   diclofenac  (VOLTAREN ) 75 MG EC tablet    Sig: Take 1 tablet (75 mg total) by mouth 2 (two) times daily.    Dispense:  180 tablet    Refill:  0   31 minutes were spent in patient encounter today due to review of multiple previous lab test imaging studies detailed physical exam and extensive patient counseling about diagnosis and plan including  multiple treatment options and expected prognosis, ordering and documentation.  Follow-Up Instructions: Return in 4 weeks (on 12/05/2023) for RA MTX/NSAID f/u 52mo.   Lonni LELON Ester, MD  Note - This record has been created using [employer].  Chart creation errors have been sought, but may not always  have been located. Such creation errors do not reflect on  the standard of medical care.

## 2023-11-07 ENCOUNTER — Ambulatory Visit: Attending: Internal Medicine | Admitting: Internal Medicine

## 2023-11-07 ENCOUNTER — Encounter: Payer: Self-pay | Admitting: Internal Medicine

## 2023-11-07 VITALS — BP 116/57 | HR 78 | Resp 16 | Ht 71.0 in | Wt 192.1 lb

## 2023-11-07 DIAGNOSIS — M0600 Rheumatoid arthritis without rheumatoid factor, unspecified site: Secondary | ICD-10-CM | POA: Diagnosis not present

## 2023-11-07 DIAGNOSIS — Z79899 Other long term (current) drug therapy: Secondary | ICD-10-CM

## 2023-11-07 MED ORDER — FOLIC ACID 1 MG PO TABS: 1.0000 mg | ORAL_TABLET | Freq: Every day | ORAL | 0 refills | Status: DC

## 2023-11-07 MED ORDER — METHOTREXATE SODIUM 2.5 MG PO TABS: 15.0000 mg | ORAL_TABLET | ORAL | 0 refills | Status: AC

## 2023-11-07 MED ORDER — DICLOFENAC SODIUM 75 MG PO TBEC: 75.0000 mg | DELAYED_RELEASE_TABLET | Freq: Two times a day (BID) | ORAL | 0 refills | Status: DC

## 2023-11-07 NOTE — Patient Instructions (Signed)
 Methotrexate  Tablets What is this medication? METHOTREXATE  (METH oh TREX ate) treats autoimmune conditions, such as arthritis and psoriasis. It works by decreasing inflammation, which can reduce pain and prevent long-term injury to the joints and skin. It may also be used to treat some types of cancer. It works by slowing down the growth of cancer cells. This medicine may be used for other purposes; ask your health care provider or pharmacist if you have questions. COMMON BRAND NAME(S): Rheumatrex, Trexall  What should I tell my care team before I take this medication? They need to know if you have any of these conditions: Dehydration Diabetes Fluid in the stomach area or lungs Frequently drink alcohol Having surgery, including dental surgery High cholesterol Immune system problems Inflammatory bowel disease, such as ulcerative colitis Kidney disease Liver disease Low blood cell levels (white cells, red cells, and platelets) Lung disease Recent or ongoing radiation Recent or upcoming vaccine Stomach ulcers, other stomach or intestine problems An unusual or allergic reaction to methotrexate , other medications, foods, dyes, or preservatives Pregnant or trying to get pregnant Breastfeeding How should I use this medication? Take this medication by mouth with water. Take it as directed on the prescription label. Do not take extra. Keep taking this medication until your care team tells you to stop. Know why you are taking this medication and how you should take it. To treat conditions such as arthritis and psoriasis, this medication is taken ONCE A WEEK as a single dose or divided into 3 smaller doses taken 12 hours apart (do not take more than 3 doses 12 hours apart each week). This medication is NEVER taken daily to treat conditions other than cancer. Taking this medication more often than directed can cause serious side effects, even death. Talk to your care team about why you are taking this  medication, how often you will take it, and what your dose is. Ask your care team to put the reason you take this medication on the prescription. If you take this medication ONCE A WEEK, choose a day of the week before you start. Ask your pharmacist to include the day of the week on the label. Avoid Monday, which could be misread as Morning. Handling this medication may be harmful. Talk to your care team about how to handle this medication. Special instructions may apply. Talk to your care team about the use of this medication in children. While it may be prescribed for selected conditions, precautions do apply. Overdosage: If you think you have taken too much of this medicine contact a poison control center or emergency room at once. NOTE: This medicine is only for you. Do not share this medicine with others. What if I miss a dose? If you miss a dose, talk with your care team. Do not take double or extra doses. What may interact with this medication? Do not take this medication with any of the following: Acitretin Live virus vaccines Probenecid This medication may also interact with the following: Alcohol Aspirin and aspirin-like medications Certain antibiotics, such as penicillin, neomycin, sulfamethoxazole; trimethoprim Certain medications for stomach problems, such as lansoprazole, omeprazole, pantoprazole Clozapine Cyclosporine Dapsone Folic acid  Foscarnet NSAIDs, medications for pain and inflammation, such as ibuprofen or naproxen Phenytoin Pyrimethamine Steroid medications, such as prednisone  or cortisone Tacrolimus Theophylline This list may not describe all possible interactions. Give your health care provider a list of all the medicines, herbs, non-prescription drugs, or dietary supplements you use. Also tell them if you smoke, drink alcohol, or use  illegal drugs. Some items may interact with your medicine. What should I watch for while using this medication? Visit your  care team for regular checks on your progress. It may be some time before you see the benefit from this medication. You may need blood work done while you are taking this medication. If your care team has also prescribed folic acid , they may instruct you to skip your folic acid  dose on the day you take methotrexate . This medication can make you more sensitive to the sun. Keep out of the sun. If you cannot avoid being in the sun, wear protective clothing and sunscreen. Do not use sun lamps, tanning beds, or tanning booths. Check with your care team if you have severe diarrhea, nausea, and vomiting, or if you sweat a lot. The loss of too much body fluid may make it dangerous for you to take this medication. This medication may increase your risk of getting an infection. Call your care team for advice if you get a fever, chills, sore throat, or other symptoms of a cold or flu. Do not treat yourself. Try to avoid being around people who are sick. Talk to your care team about your risk of cancer. You may be more at risk for certain types of cancers if you take this medication. Talk to your care team if you or your partner may be pregnant. Serious birth defects can occur if you take this medication during pregnancy and for 6 months after the last dose. You will need a negative pregnancy test before starting this medication. Contraception is recommended while taking this medication and for 6 months after the last dose. Your care team can help you find the option that works for you. If your partner can get pregnant, use a condom during sex while taking this medication and for 3 months after the last dose. Do not breastfeed while taking this medication and for 1 week after the last dose. This medication may cause infertility. Talk to your care team if you are concerned about your fertility. What side effects may I notice from receiving this medication? Side effects that you should report to your care team as soon  as possible: Allergic reactions--skin rash, itching, hives, swelling of the face, lips, tongue, or throat Dry cough, shortness of breath or trouble breathing Infection--fever, chills, cough, sore throat, wounds that don't heal, pain or trouble when passing urine, general feeling of discomfort or being unwell Kidney injury--decrease in the amount of urine, swelling of the ankles, hands, or feet Liver injury--right upper belly pain, loss of appetite, nausea, light-colored stool, dark yellow or brown urine, yellowing skin or eyes, unusual weakness or fatigue Low red blood cell level--unusual weakness or fatigue, dizziness, headache, trouble breathing Pain, tingling, or numbness in the hands or feet, muscle weakness, change in vision, confusion or trouble speaking, loss of balance or coordination, trouble walking, seizures Redness, blistering, peeling, or loosening of the skin, including inside the mouth Stomach bleeding--bloody or black, tar-like stools, vomiting blood or brown material that looks like coffee grounds Stomach pain that is severe, does not go away, or gets worse Unusual bruising or bleeding Side effects that usually do not require medical attention (report these to your care team if they continue or are bothersome): Diarrhea Dizziness Hair loss Nausea Pain, redness, or swelling with sores inside the mouth or throat Skin reactions on sun-exposed areas Vomiting This list may not describe all possible side effects. Call your doctor for medical advice about side effects.  You may report side effects to FDA at 1-800-FDA-1088. Where should I keep my medication? Keep out of the reach of children and pets. Store at room temperature between 20 and 25 degrees C (68 and 77 degrees F). Protect from light. Keep the container tightly closed. Get rid of any unused medication after the expiration date. To get rid of medications that are no longer needed or have expired: Take the medication to a  medication take-back program. Check with your pharmacy or law enforcement to find a location. If you cannot return the medication, ask your pharmacist or care team how to get rid of this medication safely. NOTE: This sheet is a summary. It may not cover all possible information. If you have questions about this medicine, talk to your doctor, pharmacist, or health care provider.  2024 [employer]vier/Gold Standard (2023-02-22 00:00:00)

## 2023-11-12 ENCOUNTER — Telehealth: Payer: Self-pay | Admitting: *Deleted

## 2023-11-12 NOTE — Telephone Encounter (Signed)
 Contacted patient that There should not be any interaction with those medications. Patient verbalized understanding.

## 2023-11-12 NOTE — Telephone Encounter (Signed)
 Patient is having injection for Peyronies Disease 11/28/2023 med Xiaflex. Patient wants to know if there is any interaction between Xiaflex and Methotrexate . Please advise.

## 2023-11-12 NOTE — Telephone Encounter (Signed)
 There should not be any interaction with those medications.

## 2023-11-15 DIAGNOSIS — M25476 Effusion, unspecified foot: Secondary | ICD-10-CM | POA: Diagnosis not present

## 2023-11-15 DIAGNOSIS — E559 Vitamin D deficiency, unspecified: Secondary | ICD-10-CM | POA: Diagnosis not present

## 2023-11-15 DIAGNOSIS — I10 Essential (primary) hypertension: Secondary | ICD-10-CM | POA: Diagnosis not present

## 2023-11-15 DIAGNOSIS — Z13220 Encounter for screening for lipoid disorders: Secondary | ICD-10-CM | POA: Diagnosis not present

## 2023-11-18 ENCOUNTER — Encounter: Admitting: Internal Medicine

## 2023-11-29 NOTE — Progress Notes (Deleted)
 Office Visit Note  Patient: Kevin Wilkinson             Date of Birth: 11-22-1970           MRN: [id number]             PCP: Johnny Garnette LABOR, MD Referring: Johnny Garnette LABOR, MD Visit Date: 12/13/2023   Subjective:  No chief complaint on file.   History of Present Illness: Kevin Wilkinson is a 53 y.o. male here for follow up for inflammatory arthritis with joint pain and swelling in multiple areas particularly bilateral hands and wrist.   Previous HPI 11/07/2023 Kevin Wilkinson is a 53 year old male here for follow-up inflammatory arthritis with joint pain and swelling in multiple areas particularly bilateral hands and wrist.   He experiences ongoing joint pain and inflammation, particularly in his hands, knuckles, toes, and legs. The pain is most severe in the morning, accompanied by tightness in his legs and groin. Prednisone  has provided some relief, especially in his shoulders, but the pain in his hands and feet persists.   He has been taking prednisone , initially at a dose of 20 mg per day, reduced to 10 mg per day. Despite this, significant joint pain continues, particularly in the mornings. Prednisone  has improved his sleep by reducing random achy pains.   Blood tests were negative for antibody markers such as ANA, rheumatoid factor, and CCP antibodies. However, his sedimentation rate was elevated, increasing from 22 to 33.   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 No swelling in the neck, lumps, bumps, rashes, or gastrointestinal symptoms.   His current medications include lisinopril , and he takes diclofenac  twice a day. He has not been taking diclofenac  while on prednisone .         Previous HPI 10/24/23 Kevin Wilkinson is a 53 year old male who presents for evaluation of joint pain and swelling in multiple areas starting since about 4 months ago  and elevated sedimentation rate.   He has experienced increased joint pain and stiffness since the spring, with symptoms progressively worsening since May. He noticed some stiffness in his neck and back in late fall of the previous year, which led him to see a chiropractor with a pretty good improvement for months. By spring, he began experiencing tightness in his groin, and subsequently, his knees, ankles, and hands became stiff and swollen. The stiffness is most pronounced in the morning, improving as the day progresses but never entirely resolving. He experiences warmth in his joints, especially in his hands, knees and sometimes in his ankles and shoulders. He describes an aching pain at night but not specifically nighttime awakening.   He does work from home much of which is from a office in the garage he is not sure if the heat has contributed to swelling.  He notes that his hands and feet have never been swollen like this before. He has difficulty with tasks requiring hand strength, such as moving a duvet or lifting objects.  He has lost several pounds which he attributes to decrease in muscle since he has been unable to workout with anything except extremely light weights or machine resistance at his gym.   He has been taking turmeric, fish oil, and collagen daily, and was previously prescribed diclofenac  for knee pain.  He takes allopurinol  300 mg daily  for gout but has not had any flareup in years. He has not had any recent imaging or x-rays since the onset of these symptoms.   He reports a history of a scaphoid injury long ago but denies any major joint injuries, fractures, or dislocations. No significant low back pain. He has had blood tests showing a slightly elevated sedimentation rate. No circulation problems like fingers turning blue or white, but he reports occasional numbness and 'pins and needles' sensations in his hands.       Labs reviewed 08/2023 Sed rate 22 CRP 4.7 Uric acid  6.1 Rheumatoid factor negative   Imaging reviewed Abdominal CT 05/08/21 L5 anterior endplate osteophytes and partial narrowing L5-S1 on my personal review   No Rheumatology ROS completed.   PMFS History:  Patient Active Problem List   Diagnosis Date Noted   Seronegative rheumatoid arthritis (HCC) 11/07/2023   High risk medication use 11/07/2023   Elevated sed rate 10/24/2023   Bilateral hand swelling 10/24/2023   Peyronie's disease 08/30/2023   Bilateral hearing loss 06/22/2020   Chronic otorrhea of left ear 06/22/2020   Deviated septum 12/03/2018   ETD (Eustachian tube dysfunction), bilateral 12/03/2018   Nasal turbinate hypertrophy 12/03/2018   Bunion 05/29/2017   Gout 07/06/2016   Hypogonadism male 10/17/2012   HTN (hypertension) 10/17/2012   ONYCHOMYCOSIS 08/18/2009   Benign neoplasm of skin 08/18/2009   Umbilical hernia 08/18/2009    Past Medical History:  Diagnosis Date   Hyperlipidemia    Hypertension    Hypogonadism in male    sees Dr. Chales     Family History  Problem Relation Age of Onset   Hypertension Other    Colon cancer Neg Hx    Esophageal cancer Neg Hx    Stomach cancer Neg Hx    Rectal cancer Neg Hx    Past Surgical History:  Procedure Laterality Date   COLONOSCOPY  11/10/2019   per Dr. Albertus, no polyps, repeat in 10 yrs    INSERTION OF MESH N/A 07/22/2014   Procedure: INSERTION OF MESH;  Surgeon: Donnice Lima, MD;  Location: [facility] OR;  Service: General;  Laterality: N/A;   TONSILLECTOMY     UMBILICAL HERNIA REPAIR N/A 07/22/2014   Procedure: UMBILICAL HERNIA REPAIR WITH MESH;  Surgeon: Donnice Lima, MD;  Location: [facility] OR;  Service: General;  Laterality: N/A;   Social History   Social History Narrative   Not on file   Immunization History  Administered Date(s) Administered   PFIZER Comirnaty(Gray Top)Covid-19 Tri-Sucrose Vaccine 11/19/2019, 12/10/2019   Tdap 06/05/2018     Objective: Vital Signs: There were no vitals taken for this  visit.   Physical Exam   Musculoskeletal Exam: ***  CDAI Exam: CDAI Score: -- Patient Global: --; Provider Global: -- Swollen: --; Tender: -- Joint Exam 12/13/2023   No joint exam has been documented for this visit   There is currently no information documented on the homunculus. Go to the Rheumatology activity and complete the homunculus joint exam.  Investigation: No additional findings.  Imaging: No results found.  Recent Labs: Lab Results  Component Value Date   WBC 5.1 08/08/2020   HGB 17.0 08/08/2020   PLT 180.0 08/08/2020   NA 137 08/08/2020   K 4.7 08/08/2020   CL 98 08/08/2020   CO2 28 08/08/2020   GLUCOSE 86 08/08/2020   BUN 19 08/08/2020   CREATININE 1.12 08/08/2020   BILITOT 1.4 (H) 08/08/2020   ALKPHOS 49 08/08/2020   AST 32  08/08/2020   ALT 33 08/08/2020   PROT 6.8 08/08/2020   ALBUMIN 4.8 08/08/2020   CALCIUM  9.8 08/08/2020   GFRAA >90 07/09/2014    Speciality Comments: No specialty comments available.  Procedures:  No procedures performed Allergies: Patient has no known allergies.   Assessment / Plan:     Visit Diagnoses: No diagnosis found.  ***  Orders: No orders of the defined types were placed in this encounter.  No orders of the defined types were placed in this encounter.    Follow-Up Instructions: No follow-ups on file.   Chimamanda Siegfried M Mera Gunkel, CMA  Note - This record has been created using Animal nutritionist.  Chart creation errors have been sought, but may not always  have been located. Such creation errors do not reflect on  the standard of medical care.

## 2023-12-05 DIAGNOSIS — N486 Induration penis plastica: Secondary | ICD-10-CM | POA: Diagnosis not present

## 2023-12-06 DIAGNOSIS — N486 Induration penis plastica: Secondary | ICD-10-CM | POA: Diagnosis not present

## 2023-12-13 ENCOUNTER — Ambulatory Visit: Admitting: Internal Medicine

## 2023-12-13 DIAGNOSIS — A6920 Lyme disease, unspecified: Secondary | ICD-10-CM

## 2023-12-13 DIAGNOSIS — M0600 Rheumatoid arthritis without rheumatoid factor, unspecified site: Secondary | ICD-10-CM

## 2023-12-13 DIAGNOSIS — Z79899 Other long term (current) drug therapy: Secondary | ICD-10-CM

## 2023-12-13 HISTORY — DX: Lyme disease, unspecified: A69.20

## 2023-12-25 NOTE — Progress Notes (Deleted)
 Office Visit Note  Patient: Kevin Wilkinson             Date of Birth: 08/13/1970           MRN: [id number]             PCP: Johnny Garnette LABOR, MD Referring: Johnny Garnette LABOR, MD Visit Date: 01/08/2024   Subjective:  No chief complaint on file.   History of Present Illness: Kevin Wilkinson is a 53 y.o. male here for follow up for inflammatory arthritis with joint pain and swelling in multiple areas particularly bilateral hands and wrist.   Previous HPI 11/07/2023 Kevin Wilkinson is a 53 year old male here for follow-up inflammatory arthritis with joint pain and swelling in multiple areas particularly bilateral hands and wrist.   He experiences ongoing joint pain and inflammation, particularly in his hands, knuckles, toes, and legs. The pain is most severe in the morning, accompanied by tightness in his legs and groin. Prednisone  has provided some relief, especially in his shoulders, but the pain in his hands and feet persists.   He has been taking prednisone , initially at a dose of 20 mg per day, reduced to 10 mg per day. Despite this, significant joint pain continues, particularly in the mornings. Prednisone  has improved his sleep by reducing random achy pains.   Blood tests were negative for antibody markers such as ANA, rheumatoid factor, and CCP antibodies. However, his sedimentation rate was elevated, increasing from 22 to 33.   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 No swelling in the neck, lumps, bumps, rashes, or gastrointestinal symptoms.   His current medications include lisinopril , and he takes diclofenac  twice a day. He has not been taking diclofenac  while on prednisone .         Previous HPI 10/24/23 Kevin Wilkinson is a 53 year old male who presents for evaluation of joint pain and swelling in multiple areas starting since about 4 months ago  and elevated sedimentation rate.   He has experienced increased joint pain and stiffness since the spring, with symptoms progressively worsening since May. He noticed some stiffness in his neck and back in late fall of the previous year, which led him to see a chiropractor with a pretty good improvement for months. By spring, he began experiencing tightness in his groin, and subsequently, his knees, ankles, and hands became stiff and swollen. The stiffness is most pronounced in the morning, improving as the day progresses but never entirely resolving. He experiences warmth in his joints, especially in his hands, knees and sometimes in his ankles and shoulders. He describes an aching pain at night but not specifically nighttime awakening.   He does work from home much of which is from a office in the garage he is not sure if the heat has contributed to swelling.  He notes that his hands and feet have never been swollen like this before. He has difficulty with tasks requiring hand strength, such as moving a duvet or lifting objects.  He has lost several pounds which he attributes to decrease in muscle since he has been unable to workout with anything except extremely light weights or machine resistance at his gym.   He has been taking turmeric, fish oil, and collagen daily, and was previously prescribed diclofenac  for knee pain.  He takes allopurinol  300 mg daily  for gout but has not had any flareup in years. He has not had any recent imaging or x-rays since the onset of these symptoms.   He reports a history of a scaphoid injury long ago but denies any major joint injuries, fractures, or dislocations. No significant low back pain. He has had blood tests showing a slightly elevated sedimentation rate. No circulation problems like fingers turning blue or white, but he reports occasional numbness and 'pins and needles' sensations in his hands.       Labs reviewed 08/2023 Sed rate 22 CRP 4.7 Uric acid  6.1 Rheumatoid factor negative   Imaging reviewed Abdominal CT 05/08/21 L5 anterior endplate osteophytes and partial narrowing L5-S1 on my personal review   No Rheumatology ROS completed.   PMFS History:  Patient Active Problem List   Diagnosis Date Noted   Seronegative rheumatoid arthritis (HCC) 11/07/2023   High risk medication use 11/07/2023   Elevated sed rate 10/24/2023   Bilateral hand swelling 10/24/2023   Peyronie's disease 08/30/2023   Bilateral hearing loss 06/22/2020   Chronic otorrhea of left ear 06/22/2020   Deviated septum 12/03/2018   ETD (Eustachian tube dysfunction), bilateral 12/03/2018   Nasal turbinate hypertrophy 12/03/2018   Bunion 05/29/2017   Gout 07/06/2016   Hypogonadism male 10/17/2012   HTN (hypertension) 10/17/2012   ONYCHOMYCOSIS 08/18/2009   Benign neoplasm of skin 08/18/2009   Umbilical hernia 08/18/2009    Past Medical History:  Diagnosis Date   Hyperlipidemia    Hypertension    Hypogonadism in male    sees Dr. Chales     Family History  Problem Relation Age of Onset   Hypertension Other    Colon cancer Neg Hx    Esophageal cancer Neg Hx    Stomach cancer Neg Hx    Rectal cancer Neg Hx    Past Surgical History:  Procedure Laterality Date   COLONOSCOPY  11/10/2019   per Dr. Albertus, no polyps, repeat in 10 yrs    INSERTION OF MESH N/A 07/22/2014   Procedure: INSERTION OF MESH;  Surgeon: Donnice Lima, MD;  Location: [facility] OR;  Service: General;  Laterality: N/A;   TONSILLECTOMY     UMBILICAL HERNIA REPAIR N/A 07/22/2014   Procedure: UMBILICAL HERNIA REPAIR WITH MESH;  Surgeon: Donnice Lima, MD;  Location: [facility] OR;  Service: General;  Laterality: N/A;   Social History   Social History Narrative   Not on file   Immunization History  Administered Date(s) Administered   PFIZER Comirnaty(Gray Top)Covid-19 Tri-Sucrose Vaccine 11/19/2019, 12/10/2019   Tdap 06/05/2018     Objective: Vital Signs: There were no vitals taken for this  visit.   Physical Exam   Musculoskeletal Exam: ***  CDAI Exam: CDAI Score: -- Patient Global: --; Provider Global: -- Swollen: --; Tender: -- Joint Exam 01/08/2024   No joint exam has been documented for this visit   There is currently no information documented on the homunculus. Go to the Rheumatology activity and complete the homunculus joint exam.  Investigation: No additional findings.  Imaging: No results found.  Recent Labs: Lab Results  Component Value Date   WBC 5.1 08/08/2020   HGB 17.0 08/08/2020   PLT 180.0 08/08/2020   NA 137 08/08/2020   K 4.7 08/08/2020   CL 98 08/08/2020   CO2 28 08/08/2020   GLUCOSE 86 08/08/2020   BUN 19 08/08/2020   CREATININE 1.12 08/08/2020   BILITOT 1.4 (H) 08/08/2020   ALKPHOS 49 08/08/2020   AST 32  08/08/2020   ALT 33 08/08/2020   PROT 6.8 08/08/2020   ALBUMIN 4.8 08/08/2020   CALCIUM  9.8 08/08/2020   GFRAA >90 07/09/2014    Speciality Comments: No specialty comments available.  Procedures:  No procedures performed Allergies: Patient has no known allergies.   Assessment / Plan:     Visit Diagnoses: No diagnosis found.  ***  Orders: No orders of the defined types were placed in this encounter.  No orders of the defined types were placed in this encounter.    Follow-Up Instructions: No follow-ups on file.   Charlotte Brafford M Hassell Patras, CMA  Note - This record has been created using Animal nutritionist.  Chart creation errors have been sought, but may not always  have been located. Such creation errors do not reflect on  the standard of medical care.

## 2023-12-30 ENCOUNTER — Telehealth: Payer: Self-pay

## 2023-12-30 NOTE — Telephone Encounter (Signed)
 Patient contacted the office inquiring if we could do testing for Lyme Disease. Patient states he is asking because he thought about getting a second opinion just because he was not sure if it is really Rheumatoid Arthritis or if it could be something else. Patient is also wary of side effects caused by Rheumatoid Arthritis medications. Patient states he recently had a tick born test and tested positive for Lyme Disease. Patient states he noticed there are a lot of similarities and symptoms between Lyme Disease and Rheumatoid Arthritis. Patient would like a call back. Please advise.

## 2023-12-31 DIAGNOSIS — D224 Melanocytic nevi of scalp and neck: Secondary | ICD-10-CM | POA: Diagnosis not present

## 2023-12-31 DIAGNOSIS — D2261 Melanocytic nevi of right upper limb, including shoulder: Secondary | ICD-10-CM | POA: Diagnosis not present

## 2023-12-31 DIAGNOSIS — D235 Other benign neoplasm of skin of trunk: Secondary | ICD-10-CM | POA: Diagnosis not present

## 2023-12-31 DIAGNOSIS — D2262 Melanocytic nevi of left upper limb, including shoulder: Secondary | ICD-10-CM | POA: Diagnosis not present

## 2024-01-01 ENCOUNTER — Encounter: Payer: Self-pay | Admitting: *Deleted

## 2024-01-01 NOTE — Telephone Encounter (Signed)
 LMOM for patient to call the office.

## 2024-01-01 NOTE — Telephone Encounter (Signed)
 Lyme disease could me a mimic for seronegative rheumatoid arthritis although he had no other signs suggesting this at our previous visit. Testing lyme disease is slightly complicated due to possibility of past vs current vs chronic infection. If he wants we can order to check a lyme disease test (western blot) to assess this. He did not have any signs of joint erosion on our exam and xrays so probably no particular risk if he wants to wait about trying any RA medications or monitor symptoms. We also have an appointment next week and could discuss at that time if he wants.

## 2024-01-01 NOTE — Telephone Encounter (Signed)
 Patient advised Lyme disease could me a mimic for seronegative rheumatoid arthritis although he had no other signs suggesting this at our previous visit. Testing lyme disease is slightly complicated due to possibility of past vs current vs chronic infection. Patient advised if he wants we can order to check a lyme disease test (western blot) to assess this. He did not have any signs of joint erosion on our exam and xrays so probably no particular risk if he wants to wait about trying any RA medications or monitor symptoms. We also have an appointment next week and could discuss at that time if he wants. Patient elected to cancel his follow up visit and will call back to reschedule.

## 2024-01-08 ENCOUNTER — Ambulatory Visit: Admitting: Internal Medicine

## 2024-01-08 DIAGNOSIS — M0600 Rheumatoid arthritis without rheumatoid factor, unspecified site: Secondary | ICD-10-CM

## 2024-01-08 DIAGNOSIS — Z79899 Other long term (current) drug therapy: Secondary | ICD-10-CM

## 2024-01-16 DIAGNOSIS — N486 Induration penis plastica: Secondary | ICD-10-CM | POA: Diagnosis not present

## 2024-01-17 DIAGNOSIS — N486 Induration penis plastica: Secondary | ICD-10-CM | POA: Diagnosis not present

## 2024-01-31 ENCOUNTER — Ambulatory Visit: Payer: Self-pay

## 2024-01-31 NOTE — Telephone Encounter (Signed)
 FYI Only or Action Required?: Action required by provider: request for appointment.  Patient was last seen in primary care on 08/30/2023 by Kevin Wilkinson LABOR, MD.  Called Nurse Triage reporting No chief complaint on file..  Symptoms began a week ago.  Interventions attempted: Rest, hydration, or home remedies.  Symptoms are: gradually worsening.  Triage Disposition: See HCP Within 4 Hours (Or PCP Triage)  Patient/caregiver understands and will follow disposition?: Unsure   Copied from CRM #[id number]. Topic: Clinical - Red Word Triage >> Jan 31, 2024 10:28 AM Dedra NOVAK wrote: Red Word that prompted transfer to Nurse Triage: Pt having swelling in L ankle. Warm transfer to NT Reason for Disposition  [1] Thigh, calf, or ankle swelling AND [2] only 1 side  (Exceptions: Itchy, localized swelling; swelling is chronic.)  Answer Assessment - Initial Assessment Questions 1. LOCATION: Which ankle is swollen? Where is the swelling?     Left  2. ONSET: When did the swelling start?     Last week  3. SWELLING: How bad is the swelling? Or, How large is it? (e.g., mild, moderate, severe; size of localized swelling)      Localized to the ankle,   4. PAIN: Is there any pain? If Yes, ask: How bad is it? (Scale 0-10; or none, mild, moderate, severe)     Stiffens, Tightens, Denies Pain  5. CAUSE: What do you think caused the ankle swelling?     States he is on a treatment regimen for Lyme Disease, and it has started since the third week of treatment  6. OTHER SYMPTOMS: Do you have any other symptoms? (e.g., fever, chest pain, difficulty breathing, calf pain)     Mild Calf Pain  Protocols used: Ankle Swelling-A-AH

## 2024-01-31 NOTE — Telephone Encounter (Signed)
 Noted

## 2024-02-03 ENCOUNTER — Ambulatory Visit: Payer: Self-pay | Admitting: Family Medicine

## 2024-02-03 ENCOUNTER — Encounter: Payer: Self-pay | Admitting: Family Medicine

## 2024-02-03 ENCOUNTER — Ambulatory Visit (INDEPENDENT_AMBULATORY_CARE_PROVIDER_SITE_OTHER): Admitting: Family Medicine

## 2024-02-03 ENCOUNTER — Ambulatory Visit (HOSPITAL_COMMUNITY)
Admission: RE | Admit: 2024-02-03 | Discharge: 2024-02-03 | Disposition: A | Discharge status: Home / Self Care | Source: Ambulatory Visit | Attending: Family Medicine | Admitting: Family Medicine

## 2024-02-03 VITALS — BP 112/70 | HR 87 | Temp 97.8°F | Wt 187.2 lb

## 2024-02-03 DIAGNOSIS — M2550 Pain in unspecified joint: Secondary | ICD-10-CM | POA: Diagnosis not present

## 2024-02-03 DIAGNOSIS — M7989 Other specified soft tissue disorders: Secondary | ICD-10-CM | POA: Diagnosis not present

## 2024-02-03 NOTE — Progress Notes (Signed)
   Subjective:    Patient ID: Kevin Wilkinson, male    DOB: 11/19/1970, 53 y.o.   MRN: [id number]  HPI Here for swelling in both feet and ankles, more so on the left that began a week ago. He had some mild pain in the left calf for a few days but this went away. No chest pain or SOB. He also wants to discuss his widespread joint pains and swelling. He saw Dr. Jeannetta for a rheumatology workup, and he was diagnosed with seronegative RA. He was prescribed Methotrexate , but he never took this because he was concerned about possible side effects. He then saw Dr. Almarie Dollar at the [facility] clinic in [place], and she diagnosed him with Lyme disease. He showed me the results of lab work she obtained, and the Lyme IgM was negative. She also got 12-15 different IgG levels, and 2 of these were positive. She has been treating him with Doxycycline, Amoxicillin, Probenecid, and Metronidazole , but he has not improved at all. He asks my opinion of this today. He also takes Tylenol  daily.    Review of Systems  Constitutional: Negative.   Respiratory: Negative.    Cardiovascular:  Positive for leg swelling. Negative for chest pain and palpitations.  Musculoskeletal:  Positive for arthralgias.       Objective:   Physical Exam Constitutional:      Appearance: Normal appearance.  Cardiovascular:     Rate and Rhythm: Normal rate and regular rhythm.     Pulses: Normal pulses.     Heart sounds: Normal heart sounds.  Pulmonary:     Effort: Pulmonary effort is normal.     Breath sounds: Normal breath sounds.  Musculoskeletal:     Comments: He has swelling and tenderness in both wrists and in the PIP's and DIP's of both hands. Both feet and ankles are also swollen with 1+ edema on the right and 2+ on the left. The left calf is not tender. No cords are felt. Homan's is negative.   Neurological:     Mental Status: He is alert.           Assessment & Plan:  He has had swelling in the  left foot and ankle with pain in the left calf. We will set up stat venous dopplers of the left leg to rule out a DVT. I told that I think the diagnosis of his arthralgias is much more likely to be seronegative RA than Lyme disease. I advised him to stop the medications he is taking and to follow up with Dr. Jeannetta asap.  Garnette Olmsted, MD

## 2024-02-07 ENCOUNTER — Encounter: Admitting: Internal Medicine

## 2024-02-11 ENCOUNTER — Ambulatory Visit: Attending: Internal Medicine | Admitting: Internal Medicine

## 2024-02-11 ENCOUNTER — Encounter: Payer: Self-pay | Admitting: Internal Medicine

## 2024-02-11 VITALS — BP 103/60 | HR 82 | Temp 96.7°F | Resp 16 | Ht 71.0 in | Wt 189.2 lb

## 2024-02-11 DIAGNOSIS — Z79899 Other long term (current) drug therapy: Secondary | ICD-10-CM

## 2024-02-11 DIAGNOSIS — M1000 Idiopathic gout, unspecified site: Secondary | ICD-10-CM | POA: Diagnosis not present

## 2024-02-11 DIAGNOSIS — D509 Iron deficiency anemia, unspecified: Secondary | ICD-10-CM | POA: Diagnosis not present

## 2024-02-11 DIAGNOSIS — M0600 Rheumatoid arthritis without rheumatoid factor, unspecified site: Secondary | ICD-10-CM

## 2024-02-11 NOTE — Progress Notes (Signed)
 Office Visit Note  Patient: Kevin Wilkinson             Date of Birth: 1970/04/11           MRN: [id number]             PCP: Johnny Garnette LABOR, MD Referring: Johnny Garnette LABOR, MD Visit Date: 02/11/2024   Subjective:  Medication Management (Discuss the lyme disease.)   Discussed the use of AI scribe software for clinical note transcription with the patient, who gave verbal consent to proceed.  History of Present Illness   Kevin Wilkinson is a 53 year old male with seronegative rheumatoid arthritis who presents with persistent joint inflammation and swelling. He was recommended to follow up by his family doctor after experiencing persistent joint issues and swelling.  He has persistent joint inflammation and swelling, particularly in his hands, wrists, finger joints, ankles, and feet. The swelling is most severe in the morning, causing difficulty in making a complete fist. These symptoms have been present since late May or early June.  Initially, he was prescribed diclofenac  and Tylenol , which did not alleviate his symptoms. He was then referred and we saw him in August. He expressed hesitancy about starting methotrexate  due to concerns about potential side effects.  He sought a second opinion and was diagnosed with Lyme disease by another doctor at [facility] in [place], who prescribed a regimen including doxycycline, amoxicillin, and amphotericin. He has about a week left on this treatment but has not noticed significant improvement.  He experiences swelling in his ankles and feet, which was noted by a massage therapist. A high sensitivity C-reactive protein test was elevated, indicating inflammation, although our result here was normla except for sed rate.  He experiences some back pain, described as a slight increase in the evening when standing, but it does not wake him at night. He takes diclofenac  once a day, which he feels is not helping much.  He has not experienced significant  relief from low-dose steroids previously prescribed.   Extensive review of outside labs available in [facility] data on EHR and provided by patient in office today, notable to 2 IgG bands positive on Lyme WB NOT consistent with active infection by IDSA guidelines.      Previous HPI 11/07/23 Kevin Wilkinson is a 53 year old male here for follow-up inflammatory arthritis with joint pain and swelling in multiple areas particularly bilateral hands and wrist.   He experiences ongoing joint pain and inflammation, particularly in his hands, knuckles, toes, and legs. The pain is most severe in the morning, accompanied by tightness in his legs and groin. Prednisone  has provided some relief, especially in his shoulders, but the pain in his hands and feet persists.   He has been taking prednisone , initially at a dose of 20 mg per day, reduced to 10 mg per day. Despite this, significant joint pain continues, particularly in the mornings. Prednisone  has improved his sleep by reducing random achy pains.   Blood tests were negative for antibody markers such as ANA, rheumatoid factor, and CCP antibodies. However, his sedimentation rate was elevated, increasing from 22 to 33.   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 No swelling in the neck, lumps, bumps, rashes, or gastrointestinal symptoms.   His current medications include lisinopril , and he takes diclofenac  twice a day. He has  not been taking diclofenac  while on prednisone .         Previous HPI 10/24/23 Kevin Wilkinson is a 53 year old male who presents for evaluation of joint pain and swelling in multiple areas starting since about 4 months ago and elevated sedimentation rate.   He has experienced increased joint pain and stiffness since the spring, with symptoms progressively worsening since May. He noticed some stiffness  in his neck and back in late fall of the previous year, which led him to see a chiropractor with a pretty good improvement for months. By spring, he began experiencing tightness in his groin, and subsequently, his knees, ankles, and hands became stiff and swollen. The stiffness is most pronounced in the morning, improving as the day progresses but never entirely resolving. He experiences warmth in his joints, especially in his hands, knees and sometimes in his ankles and shoulders. He describes an aching pain at night but not specifically nighttime awakening.   He does work from home much of which is from a office in the garage he is not sure if the heat has contributed to swelling.  He notes that his hands and feet have never been swollen like this before. He has difficulty with tasks requiring hand strength, such as moving a duvet or lifting objects.  He has lost several pounds which he attributes to decrease in muscle since he has been unable to workout with anything except extremely light weights or machine resistance at his gym.   He has been taking turmeric, fish oil, and collagen daily, and was previously prescribed diclofenac  for knee pain.  He takes allopurinol  300 mg daily for gout but has not had any flareup in years. He has not had any recent imaging or x-rays since the onset of these symptoms.   He reports a history of a scaphoid injury long ago but denies any major joint injuries, fractures, or dislocations. No significant low back pain. He has had blood tests showing a slightly elevated sedimentation rate. No circulation problems like fingers turning blue or white, but he reports occasional numbness and 'pins and needles' sensations in his hands.       Labs reviewed 08/2023 Sed rate 22 CRP 4.7 Uric acid 6.1 Rheumatoid factor negative   Imaging reviewed Abdominal CT 05/08/21 L5 anterior endplate osteophytes and partial narrowing L5-S1 on my personal review   Review of Systems   Constitutional:  Positive for fatigue.  HENT:  Negative for mouth sores and mouth dryness.   Eyes:  Negative for dryness.  Respiratory:  Negative for shortness of breath.   Cardiovascular:  Negative for chest pain and palpitations.  Gastrointestinal:  Negative for blood in stool, constipation and diarrhea.  Endocrine: Negative for increased urination.  Genitourinary:  Negative for involuntary urination.  Musculoskeletal:  Positive for joint pain, joint pain, joint swelling, myalgias, muscle weakness, morning stiffness, muscle tenderness and myalgias. Negative for gait problem.  Skin:  Negative for color change, rash, hair loss and sensitivity to sunlight.  Allergic/Immunologic: Negative for susceptible to infections.  Neurological:  Negative for dizziness and headaches.  Hematological:  Negative for swollen glands.  Psychiatric/Behavioral:  Positive for sleep disturbance. Negative for depressed mood. The patient is not nervous/anxious.     PMFS History:  Patient Active Problem List   Diagnosis Date Noted   Seronegative rheumatoid arthritis (HCC) 11/07/2023   High risk medication use 11/07/2023   Elevated sed rate 10/24/2023   Bilateral hand swelling 10/24/2023   Peyronie's disease  08/30/2023   Bilateral hearing loss 06/22/2020   Chronic otorrhea of left ear 06/22/2020   Deviated septum 12/03/2018   ETD (Eustachian tube dysfunction), bilateral 12/03/2018   Nasal turbinate hypertrophy 12/03/2018   Bunion 05/29/2017   Gout 07/06/2016   Hypogonadism male 10/17/2012   HTN (hypertension) 10/17/2012   ONYCHOMYCOSIS 08/18/2009   Benign neoplasm of skin 08/18/2009   Umbilical hernia 08/18/2009    Past Medical History:  Diagnosis Date   Hyperlipidemia    Hypertension    Hypogonadism in male    sees Dr. Chales    Lyme disease 12/13/2023    Family History  Problem Relation Age of Onset   Hypertension Other    Colon cancer Neg Hx    Esophageal cancer Neg Hx    Stomach  cancer Neg Hx    Rectal cancer Neg Hx    Past Surgical History:  Procedure Laterality Date   COLONOSCOPY  11/10/2019   per Dr. Albertus, no polyps, repeat in 10 yrs    INSERTION OF MESH N/A 07/22/2014   Procedure: INSERTION OF MESH;  Surgeon: Donnice Lima, MD;  Location: [facility] OR;  Service: General;  Laterality: N/A;   TONSILLECTOMY     UMBILICAL HERNIA REPAIR N/A 07/22/2014   Procedure: UMBILICAL HERNIA REPAIR WITH MESH;  Surgeon: Donnice Lima, MD;  Location: [facility] OR;  Service: General;  Laterality: N/A;   Social History   Social History Narrative   Not on file   Immunization History  Administered Date(s) Administered   PFIZER Comirnaty(Gray Top)Covid-19 Tri-Sucrose Vaccine 11/19/2019, 12/10/2019   Tdap 06/05/2018     Objective: Vital Signs: BP 103/60   Pulse 82   Temp (!) 96.7 F (35.9 C)   Resp 16   Ht 5' 11 (1.803 m)   Wt 189 lb 3.2 oz (85.8 kg)   BMI 26.39 kg/m    Physical Exam Eyes:     Conjunctiva/sclera: Conjunctivae normal.  Cardiovascular:     Rate and Rhythm: Normal rate and regular rhythm.  Pulmonary:     Effort: Pulmonary effort is normal.     Breath sounds: Normal breath sounds.  Lymphadenopathy:     Cervical: No cervical adenopathy.  Skin:    General: Skin is warm and dry.  Neurological:     Mental Status: He is alert.  Psychiatric:        Mood and Affect: Mood normal.      Musculoskeletal Exam:  Shoulders full ROM no tenderness or swelling Elbows slightly restricted extension range of motion with soft endpoints, trace swelling Right wrist swelling more prominent dorsally, limited in flexion and extension range of motion but not tender to pressure Swelling throughout MCP and PIP joints of both hands, unable to fully flatten hand, no warmth or erythema No paraspinal tenderness to palpation over upper and lower back Hip normal internal and external rotation without pain, no tenderness to lateral hip palpation Knees full ROM and no tenderness to  pressure, very minor swelling, some patellofemoral crepitus Ankles full ROM no tenderness or swelling  Investigation: No additional findings.  Imaging: VAS US  LOWER EXTREMITY VENOUS (DVT) Result Date: 02/03/2024  Lower Venous DVT Study Patient Name:  KRU ALLMAN  Date of Exam:   02/03/2024 Medical Rec #: [id number]       Accession #:    [id number] Date of Birth: 10/14/1970       Patient Gender: M Patient Age:   46 years Exam Location:  [address] Procedure:  VAS US  LOWER EXTREMITY VENOUS (DVT) Referring Phys: GARNETTE FRY --------------------------------------------------------------------------------  Other Indications: Intermittent left lower extremity swelling and pain in the                    ankle and left calf. Performing Technologist: Edsel Mustard RVT  Examination Guidelines: A complete evaluation includes B-mode imaging, spectral Doppler, color Doppler, and power Doppler as needed of all accessible portions of each vessel. Bilateral testing is considered an integral part of a complete examination. Limited examinations for reoccurring indications may be performed as noted. The reflux portion of the exam is performed with the patient in reverse Trendelenburg.  +-----+---------------+---------+-----------+----------+--------------+ RIGHTCompressibilityPhasicitySpontaneityPropertiesThrombus Aging +-----+---------------+---------+-----------+----------+--------------+ CFV  Full           Yes      Yes                                 +-----+---------------+---------+-----------+----------+--------------+   +---------+---------------+---------+-----------+----------+--------------+ LEFT     CompressibilityPhasicitySpontaneityPropertiesThrombus Aging +---------+---------------+---------+-----------+----------+--------------+ CFV      Full           Yes      Yes                                 +---------+---------------+---------+-----------+----------+--------------+  SFJ      Full           Yes      Yes                                 +---------+---------------+---------+-----------+----------+--------------+ FV Prox  Full           Yes      Yes                                 +---------+---------------+---------+-----------+----------+--------------+ FV Mid   Full           Yes      Yes                                 +---------+---------------+---------+-----------+----------+--------------+ FV DistalFull           Yes      Yes                                 +---------+---------------+---------+-----------+----------+--------------+ PFV      Full                                                        +---------+---------------+---------+-----------+----------+--------------+ POP      Full           Yes      Yes                                 +---------+---------------+---------+-----------+----------+--------------+ PTV      Full                                                        +---------+---------------+---------+-----------+----------+--------------+  PERO     Full                                                        +---------+---------------+---------+-----------+----------+--------------+ GSV      Full                                                        +---------+---------------+---------+-----------+----------+--------------+   Left Technical Findings: Complex cystic structure visualized in the left popliteal fossa measuring 2.7 x 2.7 cm trv.  Summary: RIGHT: - No evidence of common femoral vein obstruction.   LEFT: - No evidence of deep vein thrombosis in the lower extremity. No indirect evidence of obstruction proximal to the inguinal ligament.  - A complex cystic structure is found in the popliteal fossa.  *See table(s) above for measurements and observations. Electronically signed by Dorn Lesches MD on 02/03/2024 at 1:25:50 PM.    Final     Recent Labs: Lab Results  Component Value  Date   WBC 7.5 02/19/2024   HGB 10.9 (L) 02/19/2024   PLT 584 (H) 02/19/2024   NA 137 02/19/2024   K 4.9 02/19/2024   CL 99 02/19/2024   CO2 29 02/19/2024   GLUCOSE 107 (H) 02/19/2024   BUN 22 02/19/2024   CREATININE 0.66 (L) 02/19/2024   BILITOT 0.5 02/19/2024   ALKPHOS 49 08/08/2020   AST 23 02/19/2024   ALT 32 02/19/2024   PROT 6.6 02/19/2024   ALBUMIN 4.8 08/08/2020   CALCIUM  10.0 02/19/2024   GFRAA >90 07/09/2014    Speciality Comments: No specialty comments available.  Procedures:  No procedures performed Allergies: Patient has no known allergies.   Assessment / Plan:     Visit Diagnoses: Seronegative rheumatoid arthritis (HCC) - Plan: Sedimentation rate, C-reactive protein, CBC with Differential/Platelet, Comprehensive metabolic panel with GFR Chronic joint inflammation in hands, wrists, finger joints, ankles, and feet. Symptoms persist despite diclofenac  and previous low-dose steroids. Methotrexate  recommended but not initiated due to side effect concerns. Current doxycycline and nystatin treatment has been ongoing. Presentation suggests seronegative inflammatory arthritis, not active infection. Methotrexate  is first-line for peripheral joint inflammation, with potential GI, hair, nail, and liver side effects. Requires liver function monitoring every three months. - Complete current course of doxycycline and nystatin. - Recheck inflammatory markers (sed rate, CRP, metabolic panel) post-treatment. - Initiate methotrexate  after one-week washout post-antibiotics.  High risk medication use - Plan: Sedimentation rate, C-reactive protein, CBC with Differential/Platelet, Comprehensive metabolic panel with GFR Discussed again side effects and monitoring, GI intolerance, hepatotoxicity, infection risk. He does not drink significant alcohol at baseline and labs were normal. Will repeat today. - Monitor liver function tests every three months if methotrexate  is initiated.  History  of Lyme disease exposure Previous Lyme disease treatment with doxycycline and amoxicillin. Current tests show past exposure, no active infection. Persistent joint inflammation suggests seronegative inflammatory arthritis, not active Lyme disease. Antibiotics unlikely to resolve non-infectious inflammation. - Complete current course of doxycycline and nystatin. - Recheck inflammatory markers post-treatment to assess response. - Consider alternative treatments if inflammation persists post-antibiotics.        Orders: Orders Placed This Encounter  Procedures  Sedimentation rate   C-reactive protein   CBC with Differential/Platelet   Comprehensive metabolic panel with GFR   No orders of the defined types were placed in this encounter.  I personally spent a total of 40 minutes in the care of the patient today including preparing to see the patient, getting/reviewing separately obtained history, performing a medically appropriate exam/evaluation, counseling and educating, placing orders, documenting clinical information in the EHR, independently interpreting results, and communicating results.   Follow-Up Instructions: No follow-ups on file.   Lonni LELON Ester, MD  Note - This record has been created using [employer].  Chart creation errors have been sought, but may not always  have been located. Such creation errors do not reflect on  the standard of medical care.

## 2024-02-11 NOTE — Patient Instructions (Signed)
 Kevin Wilkinson

## 2024-02-13 ENCOUNTER — Encounter: Payer: Self-pay | Admitting: Internal Medicine

## 2024-02-19 ENCOUNTER — Other Ambulatory Visit: Payer: Self-pay | Admitting: *Deleted

## 2024-02-19 DIAGNOSIS — M0600 Rheumatoid arthritis without rheumatoid factor, unspecified site: Secondary | ICD-10-CM | POA: Diagnosis not present

## 2024-02-19 DIAGNOSIS — Z79899 Other long term (current) drug therapy: Secondary | ICD-10-CM

## 2024-02-21 LAB — COMPREHENSIVE METABOLIC PANEL WITH GFR
AG Ratio: 1.4 (calc) (ref 1.0–2.5)
ALT: 32 U/L (ref 9–46)
AST: 23 U/L (ref 10–35)
Albumin: 3.8 g/dL (ref 3.6–5.1)
Alkaline phosphatase (APISO): 139 U/L (ref 35–144)
BUN/Creatinine Ratio: 33 (calc) — ABNORMAL HIGH (ref 6–22)
BUN: 22 mg/dL (ref 7–25)
CO2: 29 mmol/L (ref 20–32)
Calcium: 10 mg/dL (ref 8.6–10.3)
Chloride: 99 mmol/L (ref 98–110)
Creat: 0.66 mg/dL — ABNORMAL LOW (ref 0.70–1.30)
Globulin: 2.8 g/dL (ref 1.9–3.7)
Glucose, Bld: 107 mg/dL — ABNORMAL HIGH (ref 65–99)
Potassium: 4.9 mmol/L (ref 3.5–5.3)
Sodium: 137 mmol/L (ref 135–146)
Total Bilirubin: 0.5 mg/dL (ref 0.2–1.2)
Total Protein: 6.6 g/dL (ref 6.1–8.1)
eGFR: 111 mL/min/1.73m2 (ref >=60)

## 2024-02-21 LAB — CBC WITH DIFFERENTIAL/PLATELET
Absolute Lymphocytes: 930 {cells}/uL (ref 850–3900)
Absolute Monocytes: 1050 {cells}/uL — ABNORMAL HIGH (ref 200–950)
Basophils Absolute: 38 {cells}/uL (ref 0–200)
Basophils Relative: 0.5 %
Eosinophils Absolute: 180 {cells}/uL (ref 15–500)
Eosinophils Relative: 2.4 %
HCT: 35.1 % — ABNORMAL LOW (ref 39.4–51.1)
Hemoglobin: 10.9 g/dL — ABNORMAL LOW (ref 13.2–17.1)
MCH: 24.7 pg — ABNORMAL LOW (ref 27.0–33.0)
MCHC: 31.1 g/dL — ABNORMAL LOW (ref 31.6–35.4)
MCV: 79.6 fL — ABNORMAL LOW (ref 81.4–101.7)
MPV: 8.9 fL (ref 7.5–12.5)
Monocytes Relative: 14 %
Neutro Abs: 5303 {cells}/uL (ref 1500–7800)
Neutrophils Relative %: 70.7 %
Platelets: 584 Thousand/uL — ABNORMAL HIGH (ref 140–400)
RBC: 4.41 Million/uL (ref 4.20–5.80)
RDW: 14.3 % (ref 11.0–15.0)
Total Lymphocyte: 12.4 %
WBC: 7.5 Thousand/uL (ref 3.8–10.8)

## 2024-02-21 LAB — C-REACTIVE PROTEIN: CRP: 128 mg/L — ABNORMAL HIGH (ref <8.0)

## 2024-02-21 LAB — SEDIMENTATION RATE: Sed Rate: 50 mm/h — ABNORMAL HIGH (ref 0–20)

## 2024-02-22 ENCOUNTER — Other Ambulatory Visit: Payer: Self-pay | Admitting: Internal Medicine

## 2024-02-22 DIAGNOSIS — M0600 Rheumatoid arthritis without rheumatoid factor, unspecified site: Secondary | ICD-10-CM

## 2024-02-24 ENCOUNTER — Ambulatory Visit: Payer: Self-pay | Admitting: Internal Medicine

## 2024-02-24 DIAGNOSIS — D509 Iron deficiency anemia, unspecified: Secondary | ICD-10-CM | POA: Insufficient documentation

## 2024-02-24 MED ORDER — FOLIC ACID 1 MG PO TABS: 1.0000 mg | ORAL_TABLET | Freq: Every day | ORAL | 0 refills | Status: AC

## 2024-02-24 MED ORDER — PREDNISONE 10 MG PO TABS: ORAL_TABLET | ORAL | 0 refills | Status: AC

## 2024-02-24 MED ORDER — METHOTREXATE SODIUM 2.5 MG PO TABS: 15.0000 mg | ORAL_TABLET | ORAL | 0 refills | Status: DC

## 2024-02-24 NOTE — Addendum Note (Signed)
 Addended by: JEANNETTA LONNI ORN on: 02/24/2024 09:50 AM   Modules accepted: Orders

## 2024-02-24 NOTE — Telephone Encounter (Signed)
 Last Fill: 11/07/2023  Labs: 02/19/2024 Hemoglobin 10.9 HCT 35.1 MCV 79.6 MCH 24.7 MCHC 31.1 Platelets 584 Absolute Monocytes 1050 Glucose 107 Creat 0.66 BUN/Creatinine 33  Next Visit: none of file  Last Visit: 02/11/2024  DX: Seronegative rheumatoid arthritis   Current Dose per office note 02/11/2024: dose not mentioned  Okay to refill Diclofenac ?

## 2024-02-24 NOTE — Progress Notes (Signed)
 Please contact Kevin Wilkinson about scheduling a follow-up appointment in approximately 6-8 weeks to recheck symptoms after starting methotrexate . He was instructed to come in for a labs recheck the third week of December for monitoring after starting methotrexate .  FYI-I spoke with Kevin Wilkinson about his labs these do show worsening inflammation numbers compared to our initial assessment earlier this year.  In particular CRP which was normal is now increased to 128.  He also has a new finding of mild microcytic anemia with hemoglobin of 10.9 and MCV of 79.6.  I discussed that this along with the high platelet count could be associated with anemia of chronic disease or could be related with his prolonged course of multiple oral antibiotic and antifungal medications.  Otherwise his kidney and liver function test are fine and no contraindication for starting methotrexate  as planned.  I am also starting him on oral prednisone  tapering initially from 20 mg daily down to 10 mg daily for the next 4 weeks.  Instructed him to stop taking the diclofenac  at the same time also in the setting of new methotrexate  start and with the microcytic anemia.

## 2024-03-05 DIAGNOSIS — N486 Induration penis plastica: Secondary | ICD-10-CM | POA: Diagnosis not present

## 2024-03-06 DIAGNOSIS — N486 Induration penis plastica: Secondary | ICD-10-CM | POA: Diagnosis not present

## 2024-03-11 ENCOUNTER — Other Ambulatory Visit: Payer: Self-pay | Admitting: *Deleted

## 2024-03-11 DIAGNOSIS — Z79899 Other long term (current) drug therapy: Secondary | ICD-10-CM | POA: Diagnosis not present

## 2024-03-11 DIAGNOSIS — M0600 Rheumatoid arthritis without rheumatoid factor, unspecified site: Secondary | ICD-10-CM

## 2024-03-11 DIAGNOSIS — D509 Iron deficiency anemia, unspecified: Secondary | ICD-10-CM

## 2024-03-12 LAB — COMPREHENSIVE METABOLIC PANEL WITH GFR
AG Ratio: 1.6 (calc) (ref 1.0–2.5)
ALT: 36 U/L (ref 9–46)
AST: 25 U/L (ref 10–35)
Albumin: 4.2 g/dL (ref 3.6–5.1)
Alkaline phosphatase (APISO): 83 U/L (ref 35–144)
BUN/Creatinine Ratio: 26 (calc) — ABNORMAL HIGH (ref 6–22)
BUN: 16 mg/dL (ref 7–25)
CO2: 31 mmol/L (ref 20–32)
Calcium: 10.1 mg/dL (ref 8.6–10.3)
Chloride: 100 mmol/L (ref 98–110)
Creat: 0.62 mg/dL — ABNORMAL LOW (ref 0.70–1.30)
Globulin: 2.7 g/dL (ref 1.9–3.7)
Glucose, Bld: 79 mg/dL (ref 65–99)
Potassium: 4.4 mmol/L (ref 3.5–5.3)
Sodium: 139 mmol/L (ref 135–146)
Total Bilirubin: 0.6 mg/dL (ref 0.2–1.2)
Total Protein: 6.9 g/dL (ref 6.1–8.1)
eGFR: 114 mL/min/1.73m2 (ref >=60)

## 2024-03-12 LAB — CBC WITH DIFFERENTIAL/PLATELET
Absolute Lymphocytes: 1183 {cells}/uL (ref 850–3900)
Absolute Monocytes: 746 {cells}/uL (ref 200–950)
Basophils Absolute: 36 {cells}/uL (ref 0–200)
Basophils Relative: 0.4 %
Eosinophils Absolute: 46 {cells}/uL (ref 15–500)
Eosinophils Relative: 0.5 %
HCT: 38.3 % — ABNORMAL LOW (ref 39.4–51.1)
Hemoglobin: 12.3 g/dL — ABNORMAL LOW (ref 13.2–17.1)
MCH: 25.2 pg — ABNORMAL LOW (ref 27.0–33.0)
MCHC: 32.1 g/dL (ref 31.6–35.4)
MCV: 78.5 fL — ABNORMAL LOW (ref 81.4–101.7)
MPV: 9.1 fL (ref 7.5–12.5)
Monocytes Relative: 8.2 %
Neutro Abs: 7089 {cells}/uL (ref 1500–7800)
Neutrophils Relative %: 77.9 %
Platelets: 415 Thousand/uL — ABNORMAL HIGH (ref 140–400)
RBC: 4.88 Million/uL (ref 4.20–5.80)
RDW: 16.3 % — ABNORMAL HIGH (ref 11.0–15.0)
Total Lymphocyte: 13 %
WBC: 9.1 Thousand/uL (ref 3.8–10.8)

## 2024-03-12 LAB — IRON,TIBC AND FERRITIN PANEL
%SAT: 11 % — ABNORMAL LOW (ref 20–48)
Ferritin: 348 ng/mL (ref 38–380)
Iron: 31 ug/dL — ABNORMAL LOW (ref 50–180)
TIBC: 289 ug/dL (ref 250–425)

## 2024-03-12 LAB — C-REACTIVE PROTEIN: CRP: 52.7 mg/L — ABNORMAL HIGH (ref <8.0)

## 2024-03-30 ENCOUNTER — Other Ambulatory Visit: Payer: Self-pay

## 2024-03-30 DIAGNOSIS — M0600 Rheumatoid arthritis without rheumatoid factor, unspecified site: Secondary | ICD-10-CM

## 2024-03-30 MED ORDER — METHOTREXATE SODIUM 2.5 MG PO TABS: 15.0000 mg | ORAL_TABLET | ORAL | 0 refills | Status: AC

## 2024-04-16 ENCOUNTER — Ambulatory Visit: Admitting: Internal Medicine

## 2024-04-22 NOTE — Assessment & Plan Note (Addendum)
 No serious interval infections. Had CBC and CMP in December labs appropriate after methotrexate  start. Discussed risks of Humira including infections, malignancy, allergies, injection site reactions, and need for regular lab monitoring. TB screening is ordered today. - Checking Quantiferon today - Discussed potential need for dermatology follow-up if on Humira long-term, he already sees dermatology annually Orders:   C-reactive protein   QuantiFERON-TB Gold Plus

## 2024-04-22 NOTE — Progress Notes (Signed)
 "  Office Visit Note  Patient: Kevin Wilkinson             Date of Birth: 08-01-1970           MRN: [id number]             PCP: Johnny Garnette LABOR, MD Referring: Johnny Garnette LABOR, MD Visit Date: 04/30/2024   Subjective:  Discussed the use of AI scribe software for clinical note transcription with the patient, who gave verbal consent to proceed.  History of Present Illness   Kevin Wilkinson is a 54 year old male with seronegative rheumatoid arthritis who presents with persistent joint pain and stiffness on methotrexate  15 mg PO weekly and folic acid  1 mg daily.  He experiences persistent swelling and stiffness in his hands, knees, and ankles, particularly in the mornings. His hands feel 'tough', and he has difficulty with activities such as crouching and doing pushups. These symptoms have been consistent and are discouraging and frustrating.  He has been on methotrexate  and prednisone , noting slight improvement in discomfort when on both drugs. He took prednisone  for two weeks, with two tablets daily for the first week and one tablet daily for the second week. On just the methotrexate  for the past month he does not see a good benefit. He has been off the previous diclofenac  and just taking tylenol  as needed.  He recently returned to the gym, engaging in light weightlifting to maintain activity and routine. Despite this, he continues to experience significant limitations in physical activities due to joint stiffness and pain.  A blood test on December 17th was performed while he was on methotrexate  and prednisone . He was found to be slightly iron deficient with inappropriately normal TIBC and with mild microcytic anemia. No history of gastrointestinal bleeding. He has had a normal screening colonoscopy.   Previous HPI 02/11/2024 Kevin Wilkinson is a 54 year old male with seronegative rheumatoid arthritis who presents with persistent joint inflammation and swelling. He was recommended to follow up by  his family doctor after experiencing persistent joint issues and swelling.   He has persistent joint inflammation and swelling, particularly in his hands, wrists, finger joints, ankles, and feet. The swelling is most severe in the morning, causing difficulty in making a complete fist. These symptoms have been present since late May or early June.   Initially, he was prescribed diclofenac  and Tylenol , which did not alleviate his symptoms. He was then referred and we saw him in August. He expressed hesitancy about starting methotrexate  due to concerns about potential side effects.   He sought a second opinion and was diagnosed with Lyme disease by another doctor at [facility] in [place], who prescribed a regimen including doxycycline, amoxicillin, and amphotericin. He has about a week left on this treatment but has not noticed significant improvement.   He experiences swelling in his ankles and feet, which was noted by a massage therapist. A high sensitivity C-reactive protein test was elevated, indicating inflammation, although our result here was normla except for sed rate.   He experiences some back pain, described as a slight increase in the evening when standing, but it does not wake him at night. He takes diclofenac  once a day, which he feels is not helping much.   He has not experienced significant relief from low-dose steroids previously prescribed.    Extensive review of outside labs available in [facility] data on EHR and provided by patient in office today, notable to 2 IgG bands  positive on Lyme WB NOT consistent with active infection by IDSA guidelines.        Previous HPI 11/07/23 Kevin Wilkinson is a 54 year old male here for follow-up inflammatory arthritis with joint pain and swelling in multiple areas particularly bilateral hands and wrist.   He experiences ongoing joint pain and inflammation, particularly in his hands, knuckles, toes, and legs. The pain is most severe in the  morning, accompanied by tightness in his legs and groin. Prednisone  has provided some relief, especially in his shoulders, but the pain in his hands and feet persists.   He has been taking prednisone , initially at a dose of 20 mg per day, reduced to 10 mg per day. Despite this, significant joint pain continues, particularly in the mornings. Prednisone  has improved his sleep by reducing random achy pains.   Blood tests were negative for antibody markers such as ANA, rheumatoid factor, and CCP antibodies. However, his sedimentation rate was elevated, increasing from 22 to 33.   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 No swelling in the neck, lumps, bumps, rashes, or gastrointestinal symptoms.   His current medications include lisinopril , and he takes diclofenac  twice a day. He has not been taking diclofenac  while on prednisone .         Previous HPI 10/24/23 Kevin Wilkinson is a 54 year old male who presents for evaluation of joint pain and swelling in multiple areas starting since about 4 months ago and elevated sedimentation rate.   He has experienced increased joint pain and stiffness since the spring, with symptoms progressively worsening since May. He noticed some stiffness in his neck and back in late fall of the previous year, which led him to see a chiropractor with a pretty good improvement for months. By spring, he began experiencing tightness in his groin, and subsequently, his knees, ankles, and hands became stiff and swollen. The stiffness is most pronounced in the morning, improving as the day progresses but never entirely resolving. He experiences warmth in his joints, especially in his hands, knees and sometimes in his ankles and shoulders. He describes an aching pain at night but not specifically nighttime awakening.   He does work from home much of  which is from a office in the garage he is not sure if the heat has contributed to swelling.  He notes that his hands and feet have never been swollen like this before. He has difficulty with tasks requiring hand strength, such as moving a duvet or lifting objects.  He has lost several pounds which he attributes to decrease in muscle since he has been unable to workout with anything except extremely light weights or machine resistance at his gym.   He has been taking turmeric, fish oil, and collagen daily, and was previously prescribed diclofenac  for knee pain.  He takes allopurinol  300 mg daily for gout but has not had any flareup in years. He has not had any recent imaging or x-rays since the onset of these symptoms.   He reports a history of a scaphoid injury long ago but denies any major joint injuries, fractures, or dislocations. No significant low back pain. He has had blood tests showing a slightly elevated sedimentation rate. No circulation problems like fingers turning blue or white, but he reports occasional numbness and 'pins and needles' sensations in his hands.  Labs reviewed 08/2023 Sed rate 22 CRP 4.7 Uric acid 6.1 Rheumatoid factor negative   Imaging reviewed Abdominal CT 05/08/21 L5 anterior endplate osteophytes and partial narrowing L5-S1 on my personal review   Review of Systems  Constitutional:  Positive for fatigue.  HENT:  Negative for mouth sores and mouth dryness.   Eyes:  Negative for dryness.  Respiratory:  Negative for shortness of breath.   Cardiovascular:  Negative for chest pain and palpitations.  Gastrointestinal:  Negative for blood in stool, constipation and diarrhea.  Endocrine: Negative for increased urination.  Genitourinary:  Negative for involuntary urination.  Musculoskeletal:  Positive for joint pain, joint pain, joint swelling, muscle weakness and morning stiffness. Negative for gait problem, myalgias, muscle tenderness and myalgias.  Skin:   Negative for color change, rash, hair loss and sensitivity to sunlight.  Allergic/Immunologic: Negative for susceptible to infections.  Neurological:  Negative for dizziness and headaches.  Hematological:  Negative for swollen glands.  Psychiatric/Behavioral:  Positive for sleep disturbance. Negative for depressed mood. The patient is not nervous/anxious.     PMFS History:  Patient Active Problem List   Diagnosis Date Noted   Microcytic anemia 02/24/2024   Seronegative rheumatoid arthritis (HCC) 11/07/2023   High risk medication use 11/07/2023   Elevated sed rate 10/24/2023   Bilateral hand swelling 10/24/2023   Peyronie's disease 08/30/2023   Bilateral hearing loss 06/22/2020   Chronic otorrhea of left ear 06/22/2020   Deviated septum 12/03/2018   ETD (Eustachian tube dysfunction), bilateral 12/03/2018   Nasal turbinate hypertrophy 12/03/2018   Bunion 05/29/2017   Gout 07/06/2016   Hypogonadism male 10/17/2012   HTN (hypertension) 10/17/2012   ONYCHOMYCOSIS 08/18/2009   Benign neoplasm of skin 08/18/2009   Umbilical hernia 08/18/2009    Past Medical History:  Diagnosis Date   Hyperlipidemia    Hypertension    Hypogonadism in male    sees Dr. Chales    Lyme disease 12/13/2023    Family History  Problem Relation Age of Onset   Hypertension Other    Colon cancer Neg Hx    Esophageal cancer Neg Hx    Stomach cancer Neg Hx    Rectal cancer Neg Hx    Past Surgical History:  Procedure Laterality Date   COLONOSCOPY  11/10/2019   per Dr. Albertus, no polyps, repeat in 10 yrs    INSERTION OF MESH N/A 07/22/2014   Procedure: INSERTION OF MESH;  Surgeon: Donnice Lima, MD;  Location: [facility] OR;  Service: General;  Laterality: N/A;   TONSILLECTOMY     UMBILICAL HERNIA REPAIR N/A 07/22/2014   Procedure: UMBILICAL HERNIA REPAIR WITH MESH;  Surgeon: Donnice Lima, MD;  Location: [facility] OR;  Service: General;  Laterality: N/A;   Social History   Social History Narrative   Not on  file   Immunization History  Administered Date(s) Administered   PFIZER Comirnaty(Gray Top)Covid-19 Tri-Sucrose Vaccine 11/19/2019, 12/10/2019   Tdap 06/05/2018     Objective: Vital Signs: BP 130/79   Pulse 88   Temp (!) 96.9 F (36.1 C)   Resp 17   Ht 5' 11 (1.803 m)   Wt 188 lb 12 oz (85.6 kg)   BMI 26.33 kg/m    Physical Exam HENT:     Mouth/Throat:     Mouth: Mucous membranes are moist.     Pharynx: Oropharynx is clear.  Eyes:     Conjunctiva/sclera: Conjunctivae normal.  Cardiovascular:     Rate and Rhythm: Normal rate and regular rhythm.  Pulmonary:     Effort: Pulmonary effort is normal.     Breath sounds: Normal breath sounds.  Musculoskeletal:     Right lower leg: No edema.     Left lower leg: No edema.  Lymphadenopathy:     Cervical: No cervical adenopathy.  Skin:    General: Skin is warm and dry.     Findings: No rash.  Neurological:     Mental Status: He is alert.  Psychiatric:        Mood and Affect: Mood normal.      Musculoskeletal Exam:  Shoulders full ROM no tenderness or swelling Elbows slightly restricted extension range of motion, no tenderness Right wrist swelling more prominent dorsally, limited in flexion and extension range of motion but not tender to pressure Swelling throughout MCP and PIP joints of both hands, unable to fully flatten hand or fully extend fingers No paraspinal tenderness to palpation over upper and lower back Hip normal internal and external rotation without pain, no tenderness to lateral hip palpation Knees full ROM and no tenderness to pressure, patellofemoral crepitus Ankles full ROM no tenderness or swelling   Investigation: No additional findings.  Imaging: No results found.  Recent Labs: Lab Results  Component Value Date   WBC 9.1 03/11/2024   HGB 12.3 (L) 03/11/2024   PLT 415 (H) 03/11/2024   NA 139 03/11/2024   K 4.4 03/11/2024   CL 100 03/11/2024   CO2 31 03/11/2024   GLUCOSE 79 03/11/2024    BUN 16 03/11/2024   CREATININE 0.62 (L) 03/11/2024   BILITOT 0.6 03/11/2024   ALKPHOS 49 08/08/2020   AST 25 03/11/2024   ALT 36 03/11/2024   PROT 6.9 03/11/2024   ALBUMIN 4.8 08/08/2020   CALCIUM  10.1 03/11/2024   GFRAA >90 07/09/2014    Speciality Comments: No specialty comments available.  Procedures:  No procedures performed Allergies: Patient has no known allergies.   Assessment / Plan:     Visit Diagnoses:  Assessment & Plan Seronegative rheumatoid arthritis (HCC) Bilateral hand swelling Persistent joint inflammation and stiffness with limited response to methotrexate  and prednisone . Elevated CRP indicates significant inflammation. Concern for potential contracture due to swelling over extensor tendons in hands. - Initiated Humira (adalimumab) every two weeks. - Continue methotrexate  while starting Humira. - Educated on self-administration of Humira injections, recommending thigh as injection site. - Discussed potential side effects of Humira, including injection site reactions, increased risk of infections, and rare risk of non-melanoma skin cancers. - Discussed resuming diclofenac  BID for symptom management Orders:   Mutated Citrullinated Vimentin (MCV) Antibody  High risk medication use No serious interval infections. Had CBC and CMP in December labs appropriate after methotrexate  start. Discussed risks of Humira including infections, malignancy, allergies, injection site reactions, and need for regular lab monitoring. TB screening is ordered today. - Checking Quantiferon today - Discussed potential need for dermatology follow-up if on Humira long-term, he already sees dermatology annually Orders:   C-reactive protein   QuantiFERON-TB Gold Plus  Microcytic anemia Mild iron deficiency anemia with iron saturation at 11%. Likely contributing to fatigue but otherwise not primary complaint. - Recommended oral iron supplementation with ferrous sulfate 325 mg daily. -  Discussed potential side effects of iron supplementation, including constipation and black stools. - Consider switching to ferrous bisglycinate if gastrointestinal side effects occur. - Monitor hemoglobin and iron levels.       Follow-Up Instructions: Return in about 2 months (around 06/28/2024) for RA on MTX/ADA start f/u 2mos.  I personally spent a total of 42 minutes in the care of the patient today including preparing to see the patient, getting/reviewing separately obtained history, performing a medically appropriate exam/evaluation, counseling and educating, placing orders, referring and communicating with other health care professionals, documenting clinical information in the EHR, independently interpreting results, communicating results, and coordinating care.   Lonni LELON Ester, MD  Note - This record has been created using [employer].  Chart creation errors have been sought, but may not always  have been located. Such creation errors do not reflect on  the standard of medical care. "

## 2024-04-22 NOTE — Assessment & Plan Note (Addendum)
 Persistent joint inflammation and stiffness with limited response to methotrexate  and prednisone . Elevated CRP indicates significant inflammation. Concern for potential contracture due to swelling over extensor tendons in hands. - Initiated Humira (adalimumab) every two weeks. - Continue methotrexate  while starting Humira. - Educated on self-administration of Humira injections, recommending thigh as injection site. - Discussed potential side effects of Humira, including injection site reactions, increased risk of infections, and rare risk of non-melanoma skin cancers. - Discussed resuming diclofenac  BID for symptom management Orders:   Mutated Citrullinated Vimentin (MCV) Antibody

## 2024-04-30 ENCOUNTER — Ambulatory Visit: Admitting: Internal Medicine

## 2024-04-30 ENCOUNTER — Encounter: Payer: Self-pay | Admitting: Internal Medicine

## 2024-04-30 VITALS — BP 130/79 | HR 88 | Temp 96.9°F | Resp 17 | Ht 71.0 in | Wt 188.8 lb

## 2024-04-30 DIAGNOSIS — Z79899 Other long term (current) drug therapy: Secondary | ICD-10-CM

## 2024-04-30 DIAGNOSIS — M7989 Other specified soft tissue disorders: Secondary | ICD-10-CM

## 2024-04-30 DIAGNOSIS — D509 Iron deficiency anemia, unspecified: Secondary | ICD-10-CM | POA: Diagnosis not present

## 2024-04-30 DIAGNOSIS — M0600 Rheumatoid arthritis without rheumatoid factor, unspecified site: Secondary | ICD-10-CM | POA: Diagnosis not present

## 2024-04-30 NOTE — Patient Instructions (Addendum)
 For iron deficiency anemia I recommend adding an oral supplement once daily. The most common form would be Ferrous Sulfate 325 mg (65mg  elemental iron). If this is overly constipating or otherwise hard to take Ferrous Bisglycinate or gentle iron is another option.       Humira instructions for use (from left to right):    Call Humira Complete if additional injection education is needed: [phone]  Safety: HOLD the dose if you have signs or symptoms of an infection. You can resume once you feel better or back to your baseline. HOLD the dose if you start antibiotics to treat an infection. HOLD the dose for invasive surgeries/procedures. Your surgeon will be able to provide recommendations on when to hold BEFORE and when you are cleared to RESUME.  Injection tips: Store your medication in the fridge (where your milk goes). Allow your medication to reach to room temperature by placing on counter for at least 30 minutes before you inject. Clean the injection area before you inject with an alcohol swab or rubbing alcohol You can SELF-inject in the upper thigh or lower abdomen. Only a caregiver should administer in the back of your arm. Avoid scars, bruises, tender or swollen areas. Alternate injection sites each time. Discard your used medication in a sharps box or a laundry detergent/bleach bottle.  How to manage an injection site reaction: Remember the 5 C's: COUNTER - leave on the counter at least 30 minutes to bring medication to room temperature. This may help prevent stinging COLD - place something cold (like an ice gel pack or cold water bottle) on the injection site just before cleansing with alcohol. This may help reduce pain CLARITIN - use Claritin (generic name is loratadine) for the first two weeks of treatment or the day of, the day before, and the day after injecting. This will help to minimize injection site reactions CORTISONE CREAM - apply if injection site is irritated  and itching CALL OFFICE - if injection site reaction is bigger than the size of your fist, looks infected, blisters, or if you develop hives

## 2024-04-30 NOTE — Assessment & Plan Note (Signed)
 Mild iron deficiency anemia with iron saturation at 11%. Likely contributing to fatigue but otherwise not primary complaint. - Recommended oral iron supplementation with ferrous sulfate 325 mg daily. - Discussed potential side effects of iron supplementation, including constipation and black stools. - Consider switching to ferrous bisglycinate if gastrointestinal side effects occur. - Monitor hemoglobin and iron levels.

## 2024-04-30 NOTE — Assessment & Plan Note (Signed)
 Persistent joint inflammation and stiffness with limited response to methotrexate  and prednisone . Elevated CRP indicates significant inflammation. Concern for potential contracture due to swelling over extensor tendons in hands. - Initiated Humira (adalimumab) every two weeks. - Continue methotrexate  while starting Humira. - Educated on self-administration of Humira injections, recommending thigh as injection site. - Discussed potential side effects of Humira, including injection site reactions, increased risk of infections, and rare risk of non-melanoma skin cancers. - Discussed resuming diclofenac  BID for symptom management Orders:   Mutated Citrullinated Vimentin (MCV) Antibody

## 2024-05-01 ENCOUNTER — Telehealth: Payer: Self-pay | Admitting: Pharmacist

## 2024-05-01 LAB — C-REACTIVE PROTEIN: CRP: 96.2 mg/L — ABNORMAL HIGH

## 2024-05-01 NOTE — Telephone Encounter (Addendum)
 Submitted a Prior Authorization request to [employer] for HUMIRA via CoverMyMeds. Will update once we receive a response.  Key: [id number]  Per response, [employer] does not manage. Please contact on back of patient card  ----- Message from Lonni Ester, MD sent at 04/30/2024 12:55 PM EST ----- Needs [facility]IV for adalimumab [place] start for seronegative RA inadequate response to methotrexate . Patient would be okay to start at home. Thanks.

## 2024-05-01 NOTE — Telephone Encounter (Signed)
 Called insurance - submitted on [employer] portal  EOC ID [id number]

## 2024-06-30 ENCOUNTER — Ambulatory Visit: Admitting: Internal Medicine
